# Patient Record
Sex: Female | Born: 1975
Health system: Southern US, Community
[De-identification: ages and names within clinical notes are randomized; demographics above are authoritative.]

## PROBLEM LIST (undated history)

## (undated) DIAGNOSIS — K219 Gastro-esophageal reflux disease without esophagitis: Secondary | ICD-10-CM

## (undated) DIAGNOSIS — F32A Depression, unspecified: Secondary | ICD-10-CM

## (undated) DIAGNOSIS — D573 Sickle-cell trait: Secondary | ICD-10-CM

## (undated) DIAGNOSIS — T7840XA Allergy, unspecified, initial encounter: Secondary | ICD-10-CM

## (undated) HISTORY — DX: Depression, unspecified: F32.A

## (undated) HISTORY — DX: Gastro-esophageal reflux disease without esophagitis: K21.9

## (undated) HISTORY — DX: Allergy, unspecified, initial encounter: T78.40XA

## (undated) HISTORY — DX: Sickle-cell trait: D57.3

---

## 1997-05-27 ENCOUNTER — Inpatient Hospital Stay (HOSPITAL_COMMUNITY): Admission: RE | Admit: 1997-05-27 | Discharge: 1997-05-27 | Payer: Self-pay | Admitting: Obstetrics & Gynecology

## 1998-10-24 ENCOUNTER — Inpatient Hospital Stay (HOSPITAL_COMMUNITY): Admission: AD | Admit: 1998-10-24 | Discharge: 1998-10-24 | Payer: Self-pay | Admitting: Obstetrics

## 1998-10-28 ENCOUNTER — Emergency Department (HOSPITAL_COMMUNITY): Admission: EM | Admit: 1998-10-28 | Discharge: 1998-10-28 | Payer: Self-pay | Admitting: Emergency Medicine

## 1999-12-19 ENCOUNTER — Emergency Department (HOSPITAL_COMMUNITY): Admission: EM | Admit: 1999-12-19 | Discharge: 1999-12-19 | Payer: Self-pay | Admitting: Emergency Medicine

## 2001-04-23 ENCOUNTER — Emergency Department (HOSPITAL_COMMUNITY): Admission: EM | Admit: 2001-04-23 | Discharge: 2001-04-23 | Payer: Self-pay | Admitting: *Deleted

## 2001-09-25 ENCOUNTER — Encounter: Payer: Self-pay | Admitting: Obstetrics and Gynecology

## 2001-09-25 ENCOUNTER — Inpatient Hospital Stay (HOSPITAL_COMMUNITY): Admission: AD | Admit: 2001-09-25 | Discharge: 2001-09-25 | Payer: Self-pay | Admitting: Obstetrics and Gynecology

## 2001-11-03 ENCOUNTER — Ambulatory Visit (HOSPITAL_COMMUNITY): Admission: RE | Admit: 2001-11-03 | Discharge: 2001-11-03 | Payer: Self-pay | Admitting: *Deleted

## 2001-11-03 ENCOUNTER — Encounter: Payer: Self-pay | Admitting: *Deleted

## 2001-12-08 ENCOUNTER — Ambulatory Visit (HOSPITAL_COMMUNITY): Admission: RE | Admit: 2001-12-08 | Discharge: 2001-12-08 | Payer: Self-pay | Admitting: *Deleted

## 2001-12-08 ENCOUNTER — Encounter: Payer: Self-pay | Admitting: *Deleted

## 2002-01-02 ENCOUNTER — Inpatient Hospital Stay (HOSPITAL_COMMUNITY): Admission: AD | Admit: 2002-01-02 | Discharge: 2002-01-02 | Payer: Self-pay | Admitting: *Deleted

## 2002-03-05 ENCOUNTER — Encounter (HOSPITAL_COMMUNITY): Admission: RE | Admit: 2002-03-05 | Discharge: 2002-03-16 | Payer: Self-pay | Admitting: *Deleted

## 2002-03-16 ENCOUNTER — Inpatient Hospital Stay (HOSPITAL_COMMUNITY): Admission: AD | Admit: 2002-03-16 | Discharge: 2002-03-20 | Payer: Self-pay | Admitting: *Deleted

## 2002-03-17 ENCOUNTER — Encounter (INDEPENDENT_AMBULATORY_CARE_PROVIDER_SITE_OTHER): Payer: Self-pay | Admitting: Specialist

## 2002-03-23 ENCOUNTER — Inpatient Hospital Stay (HOSPITAL_COMMUNITY): Admission: AD | Admit: 2002-03-23 | Discharge: 2002-03-23 | Payer: Self-pay | Admitting: Obstetrics and Gynecology

## 2002-11-26 ENCOUNTER — Emergency Department (HOSPITAL_COMMUNITY): Admission: EM | Admit: 2002-11-26 | Discharge: 2002-11-26 | Payer: Self-pay | Admitting: Emergency Medicine

## 2003-01-07 ENCOUNTER — Emergency Department (HOSPITAL_COMMUNITY): Admission: EM | Admit: 2003-01-07 | Discharge: 2003-01-07 | Payer: Self-pay | Admitting: Emergency Medicine

## 2003-03-11 ENCOUNTER — Emergency Department (HOSPITAL_COMMUNITY): Admission: EM | Admit: 2003-03-11 | Discharge: 2003-03-11 | Payer: Self-pay | Admitting: Emergency Medicine

## 2003-05-14 ENCOUNTER — Emergency Department (HOSPITAL_COMMUNITY): Admission: AD | Admit: 2003-05-14 | Discharge: 2003-05-14 | Payer: Self-pay | Admitting: Family Medicine

## 2003-07-23 ENCOUNTER — Emergency Department (HOSPITAL_COMMUNITY): Admission: EM | Admit: 2003-07-23 | Discharge: 2003-07-23 | Payer: Self-pay | Admitting: Family Medicine

## 2003-10-03 ENCOUNTER — Emergency Department (HOSPITAL_COMMUNITY): Admission: EM | Admit: 2003-10-03 | Discharge: 2003-10-03 | Payer: Self-pay | Admitting: Family Medicine

## 2003-10-08 ENCOUNTER — Emergency Department (HOSPITAL_COMMUNITY): Admission: EM | Admit: 2003-10-08 | Discharge: 2003-10-08 | Payer: Self-pay | Admitting: Family Medicine

## 2004-01-04 ENCOUNTER — Emergency Department (HOSPITAL_COMMUNITY): Admission: EM | Admit: 2004-01-04 | Discharge: 2004-01-04 | Payer: Self-pay | Admitting: *Deleted

## 2004-01-25 ENCOUNTER — Inpatient Hospital Stay (HOSPITAL_COMMUNITY): Admission: AD | Admit: 2004-01-25 | Discharge: 2004-01-25 | Payer: Self-pay | Admitting: *Deleted

## 2004-02-17 ENCOUNTER — Inpatient Hospital Stay (HOSPITAL_COMMUNITY): Admission: AD | Admit: 2004-02-17 | Discharge: 2004-02-17 | Payer: Self-pay | Admitting: Obstetrics & Gynecology

## 2004-04-01 ENCOUNTER — Ambulatory Visit (HOSPITAL_COMMUNITY): Admission: RE | Admit: 2004-04-01 | Discharge: 2004-04-01 | Payer: Self-pay | Admitting: *Deleted

## 2004-06-09 ENCOUNTER — Inpatient Hospital Stay (HOSPITAL_COMMUNITY): Admission: AD | Admit: 2004-06-09 | Discharge: 2004-06-09 | Payer: Self-pay | Admitting: Obstetrics & Gynecology

## 2004-08-28 ENCOUNTER — Ambulatory Visit: Payer: Self-pay | Admitting: *Deleted

## 2004-08-28 ENCOUNTER — Inpatient Hospital Stay (HOSPITAL_COMMUNITY): Admission: RE | Admit: 2004-08-28 | Discharge: 2004-08-31 | Payer: Self-pay | Admitting: *Deleted

## 2004-12-07 ENCOUNTER — Emergency Department (HOSPITAL_COMMUNITY): Admission: EM | Admit: 2004-12-07 | Discharge: 2004-12-07 | Payer: Self-pay | Admitting: Family Medicine

## 2005-01-01 ENCOUNTER — Emergency Department (HOSPITAL_COMMUNITY): Admission: EM | Admit: 2005-01-01 | Discharge: 2005-01-01 | Payer: Self-pay | Admitting: Emergency Medicine

## 2005-02-08 ENCOUNTER — Ambulatory Visit: Payer: Self-pay | Admitting: Family Medicine

## 2005-03-02 ENCOUNTER — Ambulatory Visit: Payer: Self-pay | Admitting: *Deleted

## 2005-05-14 ENCOUNTER — Emergency Department (HOSPITAL_COMMUNITY): Admission: EM | Admit: 2005-05-14 | Discharge: 2005-05-14 | Payer: Self-pay | Admitting: Family Medicine

## 2005-05-27 ENCOUNTER — Emergency Department (HOSPITAL_COMMUNITY): Admission: EM | Admit: 2005-05-27 | Discharge: 2005-05-27 | Payer: Self-pay | Admitting: Family Medicine

## 2005-05-31 ENCOUNTER — Emergency Department (HOSPITAL_COMMUNITY): Admission: EM | Admit: 2005-05-31 | Discharge: 2005-05-31 | Payer: Self-pay | Admitting: Family Medicine

## 2005-06-10 ENCOUNTER — Ambulatory Visit: Payer: Self-pay | Admitting: Family Medicine

## 2005-06-16 ENCOUNTER — Ambulatory Visit: Payer: Self-pay | Admitting: Family Medicine

## 2005-06-28 ENCOUNTER — Ambulatory Visit: Payer: Self-pay | Admitting: Family Medicine

## 2005-06-30 ENCOUNTER — Ambulatory Visit: Payer: Self-pay | Admitting: Family Medicine

## 2005-07-13 ENCOUNTER — Ambulatory Visit: Payer: Self-pay | Admitting: Family Medicine

## 2005-11-09 ENCOUNTER — Emergency Department (HOSPITAL_COMMUNITY): Admission: EM | Admit: 2005-11-09 | Discharge: 2005-11-09 | Payer: Self-pay | Admitting: Family Medicine

## 2006-01-03 ENCOUNTER — Emergency Department (HOSPITAL_COMMUNITY): Admission: EM | Admit: 2006-01-03 | Discharge: 2006-01-03 | Payer: Self-pay | Admitting: Emergency Medicine

## 2006-01-31 ENCOUNTER — Emergency Department (HOSPITAL_COMMUNITY): Admission: EM | Admit: 2006-01-31 | Discharge: 2006-01-31 | Payer: Self-pay | Admitting: Emergency Medicine

## 2006-02-01 ENCOUNTER — Emergency Department (HOSPITAL_COMMUNITY): Admission: EM | Admit: 2006-02-01 | Discharge: 2006-02-01 | Payer: Self-pay | Admitting: Family Medicine

## 2006-02-02 ENCOUNTER — Emergency Department (HOSPITAL_COMMUNITY): Admission: EM | Admit: 2006-02-02 | Discharge: 2006-02-02 | Payer: Self-pay | Admitting: Emergency Medicine

## 2007-03-20 ENCOUNTER — Emergency Department (HOSPITAL_COMMUNITY): Admission: EM | Admit: 2007-03-20 | Discharge: 2007-03-20 | Payer: Self-pay | Admitting: Family Medicine

## 2008-04-03 ENCOUNTER — Inpatient Hospital Stay (HOSPITAL_COMMUNITY): Admission: AD | Admit: 2008-04-03 | Discharge: 2008-04-03 | Payer: Self-pay | Admitting: Obstetrics & Gynecology

## 2008-04-29 ENCOUNTER — Ambulatory Visit (HOSPITAL_COMMUNITY): Admission: RE | Admit: 2008-04-29 | Discharge: 2008-04-29 | Payer: Self-pay | Admitting: Obstetrics & Gynecology

## 2008-05-21 ENCOUNTER — Ambulatory Visit (HOSPITAL_COMMUNITY): Admission: RE | Admit: 2008-05-21 | Discharge: 2008-05-21 | Payer: Self-pay | Admitting: Family Medicine

## 2008-06-20 ENCOUNTER — Ambulatory Visit (HOSPITAL_COMMUNITY): Admission: RE | Admit: 2008-06-20 | Discharge: 2008-06-20 | Payer: Self-pay | Admitting: Family Medicine

## 2008-10-09 ENCOUNTER — Inpatient Hospital Stay (HOSPITAL_COMMUNITY): Admission: RE | Admit: 2008-10-09 | Discharge: 2008-10-11 | Payer: Self-pay | Admitting: Obstetrics & Gynecology

## 2008-10-09 ENCOUNTER — Ambulatory Visit: Payer: Self-pay | Admitting: Obstetrics & Gynecology

## 2010-03-16 ENCOUNTER — Emergency Department (HOSPITAL_COMMUNITY): Admission: EM | Admit: 2010-03-16 | Discharge: 2010-03-16 | Payer: Self-pay | Admitting: Emergency Medicine

## 2010-07-07 LAB — POCT PREGNANCY, URINE: Preg Test, Ur: NEGATIVE

## 2010-08-03 LAB — CBC
HCT: 36 % (ref 36.0–46.0)
HCT: 37.7 % (ref 36.0–46.0)
Hemoglobin: 12.4 g/dL (ref 12.0–15.0)
Hemoglobin: 13 g/dL (ref 12.0–15.0)
MCHC: 34.4 g/dL (ref 30.0–36.0)
MCHC: 34.5 g/dL (ref 30.0–36.0)
MCV: 92.7 fL (ref 78.0–100.0)
MCV: 92.8 fL (ref 78.0–100.0)
Platelets: 264 10*3/uL (ref 150–400)
Platelets: 292 10*3/uL (ref 150–400)
RBC: 3.87 MIL/uL (ref 3.87–5.11)
RBC: 4.06 MIL/uL (ref 3.87–5.11)
RDW: 13.7 % (ref 11.5–15.5)
RDW: 13.9 % (ref 11.5–15.5)
WBC: 10.8 10*3/uL — ABNORMAL HIGH (ref 4.0–10.5)
WBC: 15.3 10*3/uL — ABNORMAL HIGH (ref 4.0–10.5)

## 2010-08-03 LAB — URINALYSIS, ROUTINE W REFLEX MICROSCOPIC
Bilirubin Urine: NEGATIVE
Glucose, UA: NEGATIVE mg/dL
Ketones, ur: NEGATIVE mg/dL
Nitrite: NEGATIVE
Protein, ur: NEGATIVE mg/dL
Specific Gravity, Urine: <1.005 — ABNORMAL LOW (ref 1.005–1.030)
Urobilinogen, UA: 0.2 mg/dL (ref 0.0–1.0)
pH: 6.5 (ref 5.0–8.0)

## 2010-08-03 LAB — TYPE AND SCREEN
ABO/RH(D): A POS
Antibody Screen: NEGATIVE

## 2010-08-03 LAB — ABO/RH: ABO/RH(D): A POS

## 2010-08-03 LAB — RPR: RPR Ser Ql: NONREACTIVE

## 2010-08-03 LAB — URINE MICROSCOPIC-ADD ON

## 2010-09-08 NOTE — Op Note (Signed)
NAME:  Pam Brown, Pam Brown             ACCOUNT NO.:  [id number]   MEDICAL RECORD NO.:  [id number]          PATIENT TYPE:  INP   LOCATION:  9147                          FACILITY:  [facility]   PHYSICIAN:  Horton Chin, MD DATE OF BIRTH:  Jun 03, 1975   DATE OF PROCEDURE:  10/09/2008  DATE OF DISCHARGE:                               OPERATIVE REPORT   PREOPERATIVE DIAGNOSIS:  Intrauterine pregnancy at 39-1/[redacted] weeks  gestation, 3 prior cesarean sections.   POSTOPERATIVE DIAGNOSIS:  Intrauterine pregnancy at 39-1/[redacted] weeks  gestation, 3 prior cesarean sections.   PROCEDURE:  Repeat low transverse cesarean section via a transverse skin  incision.   SURGEON:  Horton Chin, MD   ASSISTANTS:  Javier Glazier. Okey Dupre, MD and Warren Lacy, PA student.   ANESTHESIOLOGIST:  Raul Del, MD   ANESTHESIA:  Spinal.   INDICATIONS:  The patient is a 35 year old gravida 4, para 3 at 39-1/[redacted]  weeks gestation with a history of 3 prior cesarean sections, who was  here today for a scheduled repeat cesarean section.  The patient was  counseled about the risks of surgery including, but not limited to:  bleeding, infection, injury to the surrounding organs, need for  additional procedures, abnormal placentation in subsequent pregnancies,  and written informed consent was obtained.  Of note, the patient was  offered a bilateral tubal ligation, but she declined this as she does  not desire permanent sterilization.   FINDINGS:  Viable female infant in cephalic presentation.  Apgars were 8  and 9, weight 7 pounds 3 ounces.  There was clear amniotic fluid.  Intact placenta with 3-vessel cord.  Normal uterus and bilateral adnexa.   SPECIMENS:  Placenta which was sent to labor and delivery.   IV FLUIDS:  2500 mL.   URINE OUTPUT:  400 mL.   ESTIMATED BLOOD LOSS:  600 mL.   COMPLICATIONS:  None immediate.   PROCEDURE DETAILS:  The patient had sequential compression devices  applied to her bilateral lower  extremities and received intravenous  cefotetan while in the preoperative room.  She was then taken to the  operating room where spinal analgesia was administered and found to be  adequate.  The patient was then placed in the dorsal supine position  with a leftward tilt, and prepped and draped in a sterile manner.  A  foley catheter was placed in the patient's bladder and attached to  constant gravity.  Attention was then turned to the patient's abdomen,  where a low transverse incision was made.  This was about 2 cm superior  to her prior Pfannenstiel incisions.  This incision was made with a  scalpel and carried through to the underlying layer of fascia.  The  fascia was incised in the midline and extended bilaterally using Mayo  scissors.  At this point, there was noted to be dense adhesion of the  underlying rectus muscles to the fascia and this was dissected off  sharply.  There was also a prominent rectus muscles diastasis that was  noted and so the peritoneal layer was noted to be adherent to the  fascial layer and this was also carefully incised using Metzenbaum  scissors.  This incision was extended bilaterally with good  visualization of bowel and bladder.  At this point, there were some  filmy adhesions of the anterior part of the uterus to the peritoneum,  but this was taken down using electrocautery without complication.  Attention was then turned to the lower uterine segment, where a bladder  flap was created and a transverse hysterotomy was made.  This  hysterotomy was extended bilaterally in a blunt fashion.  The infant's  head was encountered and delivered atraumatically.  The rest of the  infant's body delivered without complication.  The cord was clamped and  cut and the infant was handed to the awaiting neonatologists.  Fundal  massage was then administered and the placenta delivered intact with 3-  vessel cord.  The uterus was then cleared of all clots and debris.   The  hysterotomy was repaired using 0 Monocryl with a running interlocking  fashion.  Overall good hemostasis was noted.  The pelvic gutters were  cleared of all clot and debris.  Good hemostasis was reconfirmed on all  surfaces.  The peritoneal layer was reapproximated using 2-0 Vicryl in a  running fashion.  The fascia was reapproximated using 0 PDS in a running  fashion, and the subcutaneous layer was irrigated and some bleeders were  controlled using electrocautery.  The skin was then closed with a 4-0  Vicryl subcuticular stitch.  The patient tolerated the procedure well.  Sponge, instrument, and needle counts were correct x3.  She was taken to  the recovery room in stable condition.      Horton Chin, MD  Electronically Signed     UAA/MEDQ  D:  10/09/2008  T:  10/10/2008  Job:  [id number]

## 2010-09-08 NOTE — Discharge Summary (Signed)
NAME:  Pam Brown, DAUENHAUER             ACCOUNT NO.:  [id number]   MEDICAL RECORD NO.:  [id number]          PATIENT TYPE:  INP   LOCATION:  9147                          [facility]  [facility]   PHYSICIAN:  Horton Chin, MD DATE OF BIRTH:  Dec 07, 1975   DATE OF ADMISSION:  10/09/2008  DATE OF DISCHARGE:  10/11/2008                               DISCHARGE SUMMARY   ADMISSION DIAGNOSES:  1. Intrauterine pregnancy at 39-1/7 weeks.  2. Previous cesarean section x3.   DISCHARGE DIAGNOSES:  1. Intrauterine pregnancy at 39-1/7 weeks.  2. Previous cesarean section x3.  3. Status post cesarean delivery of a viable female infant, Apgars 8 and      9, weighing 7 pounds 3 ounces.   HOSPITAL PROCEDURES:  1. Spinal anesthesia.  2. Repeat low-transverse cesarean section via transverse skin      incision.   HOSPITAL COURSE:  The patient was admitted for an elective repeat C-  section secondary to 3 prior C-sections.  Surgery was performed by Dr.  Macon Large with Dr. Okey Dupre assisting under spinal anesthesia.  She was  delivered of a female infant, cephalic presentation, Apgars 9 and 9  weighing 7 pounds 3 ounces, amniotic fluid was clear.  EBL was 600 mL.  She was taken to Mother Baby Unit where she received routine care.  On  postop day #1, she was doing well.  Foley was removed.  IV was removed.  The patient was tolerating food and fluids and being out of bed.  She  was breast and bottle feeding her baby and on postop day #2, she was  requesting early discharge.  Vital signs were stable.  She was afebrile.  She was breastfeeding with assistance and requesting Depo-Provera for  contraception.  PE was normal.  Chest was clear.  Heart, regular rate  and rhythm.  Abdomen, soft and appropriately tender.  Incision was  clean, dry and intact with subcutaneous sutures.  Lochia was small.  Extremities within normal limits.  She was deemed to receive full  benefit of her hospital stay and was discharged home.   CONDITION AT DISCHARGE:  Good.   DISCHARGE MEDICATIONS:  1. Colace 100 mg p.o. b.i.d.  2. Motrin 600 mg p.o. q.6 h. p.r.n.  3. Percocet 1-2 p.o. q.4 h. p.r.n.  4. Depo-Provera prior to discharge.   LABORATORY FINDINGS:  Admission white blood cell count 10.8, hemoglobin  13, platelets 292.  Blood type A+.  Discharge hemoglobin was 12.4 and  platelets were 264.   DISCHARGE INSTRUCTIONS:  Per handout.   DISCHARGE FOLLOWUP:  In 6 weeks at health department or sooner going to  have if needed.      Marie L. Williams, C.N.M.      Horton Chin, MD  Electronically Signed    MLW/MEDQ  D:  10/11/2008  T:  10/11/2008  Job:  [id number]

## 2010-09-11 NOTE — Discharge Summary (Signed)
NAME:  Pam Brown, Pam Brown NO.:  [id number]   MEDICAL RECORD NO.:  [id number]          PATIENT TYPE:  INP   LOCATION:  9127                          [facility]ILITY[facility]  [facility]   PHYSICIAN:  Conni Elliot, M.D.DATE OF BIRTH:  Sep 20, 1975   DATE OF ADMISSION:  08/28/2004  DATE OF DISCHARGE:                                 DISCHARGE SUMMARY   DISCHARGE DIAGNOSIS:  Status post low transverse cesarean section secondary  to repeat cesarean section.   HOSPITAL COURSE:  This is a 35 year old G3, P3-0-0-3 status post LTCS  secondary to two previous C-sections who had routine postpartum care.  Her  incision did not have any staples.  The patient had a normal postoperative  course.  On postoperative day #3, she was discharged with Percocet 5/325  every 4 hours as needed for pain and to continue her prenatal vitamins.  Her  postoperative H&H was 12 and 37 which was on Aug 29, 2004.  The patient was  rubella immune, A positive, and antibody negative.  She had no complications  during this hospitalization.  She will have a follow-up appointment in 6  weeks at [facility].  At her postoperative appointment, the patient  would like to have a Mirena IUD placed.  She will have an appointment at  [facility] October 08, 2004 at 10:00 a.m.      OR/MEDQ  D:  08/31/2004  T:  08/31/2004  Job:  [id number]

## 2010-09-11 NOTE — Op Note (Signed)
   NAME:  Pam Brown, Pam Brown                       ACCOUNT NO.:  [id number]   MEDICAL RECORD NO.:  [id number]                   PATIENT TYPE:  INP   LOCATION:  9115                                 [facility]   PHYSICIAN:  Phil D. Okey Dupre, M.D.                  DATE OF BIRTH:  05/20/1975   DATE OF PROCEDURE:  DATE OF DISCHARGE:                                 OPERATIVE REPORT   PREOPERATIVE DIAGNOSES:  1. Cephalopelvic disproportion.  2. Failed vaginal birth after cesarean.   POSTOPERATIVE DIAGNOSES:  1. Cephalopelvic disproportion.  2. Failed vaginal birth after caesarean.   PROCEDURE:  Low flap cesarean section.   SURGEON:  Javier Glazier. Rose, M.D.   DESCRIPTION OF PROCEDURE:  Under satisfactory epidural anesthesia with the  patient placed in the supine positio and a Foley catheter in the urinary  bladder the abdomen was prepped and draped in the usual sterile manner and  entered through a Pfannenstiel incision situated 3 cm above the symphysis  pubis and extended for a total length of 14 cm.  The abdomen was entered by  layers and entering the peritoneal cavity, the visceral peritoneum and  anterior surface of the uterus was opened transversely by sharp dissection.  The bladder was pushed away from the lower uterine segment and was entered  by sharp and blunt dissection and from an LOP presentation, the baby was  easily delivered.  It was a female with Apgars of 8 and 9, weight unknown at  this point.  The cord was doubly clamped and divided and the baby handed to  the pediatrician.  The placenta was manually removed, and the uterus  explored.  The uterus was closed with a continuous running locked suture  Vicryl on an atraumatic needle.  The bladder was observed to make sure there  were no signs of any injury, and the fascia was closed by incision with  continuous alternating 0 Vicryl on an atraumatic needle.  Subcutaneous  bleeders were controlled with hot cautery.  Skin edge  approximation with  skin staples.  Dry sterile dressing was applied.  Total blood loss during  the procedure was 700 cc.  The patient tolerated the procedure well and was  transferred to the recovery room in satisfactory condition.  The urine was  bloody prior to the procedure but was deemed much less bloody  postoperatively.  The patient received 2 g of Ancef after the baby was  delivered.                                               Phil D. Okey Dupre, M.D.    PDR/MEDQ  D:  03/17/2002  T:  03/17/2002  Job:  [id number]

## 2010-09-11 NOTE — Discharge Summary (Signed)
NAME:  Pam Brown, Pam Brown                       ACCOUNT NO.:  [id number]   MEDICAL RECORD NO.:  [id number]                   PATIENT TYPE:  INP   LOCATION:  9115                                 [facility]   PHYSICIAN:  Conni Elliot, M.D.             DATE OF BIRTH:  03-08-1976   DATE OF ADMISSION:  03/16/2002  DATE OF DISCHARGE:  03/20/2002                                 DISCHARGE SUMMARY   DISCHARGE MEDICATIONS:  1. Ibuprofen 600 mg one p.o. q.6h. p.r.n.  2. Percocet 5/325 one p.o. q.4h. p.r.n.  3. Prenatal vitamins one p.o. q.d. for six weeks.  4. Micronor one p.o. q.d. to start in two weeks postpartum.   DISCHARGE INSTRUCTIONS:  The patient is instructed for no heavy lifting or  strenuous exercise for six weeks, and pelvic rest for six weeks.   DISCHARGE DIAGNOSIS:  Postoperative day #3 status post repeat low transverse  cesarean section.   HISTORY OF PRESENT ILLNESS:  The patient is a 35 year old on admission G2 P1-  0-0-1 at 53 and 0 weeks that presented for induction of labor and on the  same day contractions had started occurring every 10 minutes.  Her membranes  were intact and she had no vaginal bleeding.  She had good fetal movement.  Prenatal care was done at [facility] with onset of care at 21 weeks.  She had a previous C section for failure to progress in 1999.   MEDICAL HISTORY:  Negative.   GYNECOLOIGCAL HISTORY:  Negative.   PRENATAL LABORATORY DATA:  The patient's blood type was A positive, antibody  negative.  Platelet count 299.  She was rubella immune.  Hepatitis B  negative.  Syphilis nonreactive.  HIV negative.  GC and chlamydia negative.  She was GBS positive on October 6 with a one-hour Glucola of 77.   HOSPITAL COURSE:  For induction of labor the patient was started on low-dose  Pitocin and Penicillin G for her intrapartum treatment group B strep.  The  patient was anticipating a VBAC and received an epidural two hours later.  Fetal  heart tones remained reassuring.  The patient then received an  amnioinfusion of 500 cc of lactated Ringers, was titrated on her Pitocin.  The patient had a protracted labor pattern without fetal descent.  Fetal  heart tones showed variable decelerations.  Meconium-stained fluid was  thinned with amnioinfusion.  Cervix completely dilated but the fetus failed  to descend.  The patient was then taken to the OR for a STAT C section and  received a repeat low transverse C section after failing VBAC attempt.  A  term female infant was born with Apgars of 8 at one and 9 at 93.  Estimated  blood loss was 700 cc.  Anesthesia was through epidural.  Intact three-  vessel-cord placenta was delivered shortly after the infant.   After delivery the patient remained stable with a hemoglobin  at 11.4 from  13.2.  Vital signs remained stable.  The patient spiked one low-grade fever  on postpartum day #2 at 100.4 degrees; it was the only elevated temperature.  The patient was asymptomatic with no shortness of breath, no dysuria or CVA  tenderness, no cough or chills.  Incision remains intact with staples  without erythema or hematoma noted.  Due to the large size of her pannus we  will leave staples in for two additional days postpartum.  The patient will  return to MAU postpartum day #5 for staple removal.  Circumcision has been  done.  The patient plans to breast and bottle feed and will be starting on  Micronor for birth control two weeks postpartum.     Lorne Skeens, D.O.                         Conni Elliot, M.D.    Erick Alley  D:  03/20/2002  T:  03/20/2002  Job:  [id number]   cc:   Dr. Mal[facility]

## 2010-09-11 NOTE — Op Note (Signed)
NAME:  Pam Brown, Pam Brown NO.:  [id number]   MEDICAL RECORD NO.:  [id number]          PATIENT TYPE:  INP   LOCATION:  9127                          [facility]   PHYSICIAN:  Conni Elliot, M.D.DATE OF BIRTH:  1975/05/18   DATE OF PROCEDURE:  08/28/2004  DATE OF DISCHARGE:                                 OPERATIVE REPORT   PREOPERATIVE DIAGNOSES:  Prior cesarean delivery x2.   POSTOPERATIVE DIAGNOSES:  Prior cesarean delivery x2.   OPERATION:  Repeat low transverse cesarean.   SURGEON:  Conni Elliot, M.D.   ASSISTANT:  Tawnya Crook. Erenest Rasher, M.D.   ANESTHESIA:  Spinal.   DESCRIPTION OF PROCEDURE:  After placing the patient on spinal anesthetic,  patient supine in left lateral tilt position, the abdomen was prepped and  draped in a sterile fashion. A low transverse Pfannenstiel incision was made  through the skin and fascia. Rectus muscles incised to the midline. The  peritoneal cavity entered, bladder flap created. A low transverse uterine  incision was made. The baby was delivered from the vertex presentation with  the assistance of a vacuum. The remainder of the body was delivered. Cord  doubly clamped and cut. Baby was handed to the neonatologist in attendance.  Placenta delivered spontaneously. The uterus, bladder flap, anterior  peritoneum and fascia were closed in routine fashion. The patient was  concerned about diastasis recti; however, an attempt was made to bring the  rectus muscles back together but they were too far apart to carefully  reoppose them.  The subcutaneous skin was closed with subcutaneous stitch.  Estimated blood loss was about 700 mL without replacement.      ASG/MEDQ  D:  08/28/2004  T:  08/28/2004  Job:  [id number]

## 2011-01-28 LAB — WET PREP, GENITAL: Yeast Wet Prep HPF POC: NONE SEEN

## 2011-01-28 LAB — URINALYSIS, ROUTINE W REFLEX MICROSCOPIC
Bilirubin Urine: NEGATIVE
Glucose, UA: NEGATIVE mg/dL
Hgb urine dipstick: NEGATIVE
Ketones, ur: NEGATIVE mg/dL
Nitrite: NEGATIVE
Protein, ur: NEGATIVE mg/dL
Specific Gravity, Urine: 1.01 (ref 1.005–1.030)
Urobilinogen, UA: 0.2 mg/dL (ref 0.0–1.0)
pH: 6 (ref 5.0–8.0)

## 2011-01-28 LAB — URINE MICROSCOPIC-ADD ON

## 2014-04-02 ENCOUNTER — Encounter (HOSPITAL_COMMUNITY): Payer: Self-pay | Admitting: Emergency Medicine

## 2014-04-02 ENCOUNTER — Emergency Department (INDEPENDENT_AMBULATORY_CARE_PROVIDER_SITE_OTHER)
Admission: EM | Admit: 2014-04-02 | Discharge: 2014-04-02 | Disposition: A | Discharge status: Home / Self Care | Payer: Self-pay | Source: Home / Self Care | Attending: Family Medicine | Admitting: Family Medicine

## 2014-04-02 DIAGNOSIS — A084 Viral intestinal infection, unspecified: Secondary | ICD-10-CM

## 2014-04-02 MED ORDER — ONDANSETRON 4 MG PO TBDP
ORAL_TABLET | ORAL | Status: AC
  Filled 2014-04-02: qty 2

## 2014-04-02 MED ORDER — DIPHENOXYLATE-ATROPINE 2.5-0.025 MG PO TABS: 1.0000 | ORAL_TABLET | Freq: Four times a day (QID) | ORAL | Status: DC | PRN

## 2014-04-02 MED ORDER — ONDANSETRON 8 MG PO TBDP: 8.0000 mg | ORAL_TABLET | Freq: Three times a day (TID) | ORAL | Status: DC | PRN

## 2014-04-02 MED ORDER — ONDANSETRON 4 MG PO TBDP
8.0000 mg | ORAL_TABLET | Freq: Once | ORAL | Status: AC
  Administered 2014-04-02: 8 mg via ORAL

## 2014-04-02 NOTE — ED Provider Notes (Signed)
CSN: [id number]     Arrival date & time 04/02/14  1336 History   First MD Initiated Contact with Patient 04/02/14 1358     Chief Complaint  Patient presents with  . Diarrhea  . Emesis   (Consider location/radiation/quality/duration/timing/severity/associated sxs/prior Treatment) HPI           38 year old female presents for evaluation of vomiting and diarrhea. She woke up early this morning with acute onset of vomiting, followed soon thereafter by the diarrhea. She has had multiple episodes this morning. She is starting to have pain around her rectum as well as abdominal pain. 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 She denies fever, chills,. No recent travel. No blood in diarrhea or vomitus. No dizziness or lightheadedness.   Marland KitchenHistory reviewed. No pertinent past medical history. History reviewed. No pertinent past surgical history. No family history on file. History  Substance Use Topics  . Smoking status: Never Smoker   . Smokeless tobacco: Not on file  . Alcohol Use: No   OB History    No data available     Review of Systems  Constitutional: Negative for fever and chills.  Gastrointestinal: Positive for nausea, vomiting, abdominal pain and diarrhea. Negative for constipation and blood in stool.  Neurological: Negative for dizziness.  All other systems reviewed and are negative.   Allergies  Review of patient's allergies indicates not on file.  Home Medications   Prior to Admission medications   Medication Sig Start Date End Date Taking? Authorizing Provider  diphenoxylate-atropine (LOMOTIL) 2.5-0.025 MG per tablet Take 1-2 tablets by mouth 4 (four) times daily as needed for diarrhea or loose stools. 04/02/14   Freeman Caldron Mattia Liford, PA-C  ondansetron (ZOFRAN ODT) 8 MG disintegrating tablet Take 1 tablet (8 mg total) by mouth every 8 (eight) hours as needed for nausea or vomiting. 04/02/14   Freeman Caldron Justyn Langham, PA-C   BP 117/73 mmHg   Pulse 105  Temp(Src) 99.9 F (37.7 C) (Oral)  Resp 20  SpO2 97%  LMP 03/19/2014 Physical Exam  Constitutional: She is oriented to person, place, and time. Vital signs are normal. She appears well-developed and well-nourished. No distress.  HENT:  Head: Normocephalic and atraumatic.  Cardiovascular: Normal rate, regular rhythm and normal heart sounds.  Exam reveals no gallop and no friction rub.   No murmur heard. Tachycardic  Pulmonary/Chest: Effort normal and breath sounds normal. No respiratory distress. She has no wheezes. She has no rales.  Abdominal: Soft. Bowel sounds are normal. She exhibits no distension and no mass. There is no tenderness. There is no rebound and no guarding.  Neurological: She is alert and oriented to person, place, and time. She has normal strength. Coordination normal.  Skin: Skin is warm and dry. No rash noted. She is not diaphoretic.  Psychiatric: She has a normal mood and affect. Judgment normal.  Nursing note and vitals reviewed.   ED Course  Procedures (including critical care time) Labs Review Labs Reviewed - No data to display  Imaging Review No results found.   MDM   1. Viral gastroenteritis     physical exam is normal other than mild tachycardia. The abdomen is soft and nontender, completely benign. This is most likely a viral gastroenteritis. Will prescribe Zofran and Lomotil for symptoms. Clear liquid diet for now and advance as tolerated. Follow-up if any worsening   Meds ordered this encounter  Medications  . ondansetron (ZOFRAN-ODT) disintegrating tablet 8  mg    Sig:   . ondansetron (ZOFRAN ODT) 8 MG disintegrating tablet    Sig: Take 1 tablet (8 mg total) by mouth every 8 (eight) hours as needed for nausea or vomiting.    Dispense:  12 tablet    Refill:  0    Order Specific Question:  Supervising Provider    Answer:  Billy Fischer [phone]  . diphenoxylate-atropine (LOMOTIL) 2.5-0.025 MG per tablet    Sig: Take 1-2 tablets by  mouth 4 (four) times daily as needed for diarrhea or loose stools.    Dispense:  20 tablet    Refill:  0    Order Specific Question:  Supervising Provider    Answer:  Ihor Gully D [place], PA-C 04/02/14 1445

## 2014-04-02 NOTE — ED Notes (Signed)
Woke 2 different times with diarrhea and vomiting.  .  Reports 7 episodes of vomiting, 6 episodes of diarrhea today.  Abdominal soreness.  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

## 2014-04-02 NOTE — Discharge Instructions (Signed)
Food Choices to Help Relieve Diarrhea °When you have diarrhea, the foods you eat and your eating habits are very important. Choosing the right foods and drinks can help relieve diarrhea. Also, because diarrhea can last up to 7 days, you need to replace lost fluids and electrolytes (such as sodium, potassium, and chloride) in order to help prevent dehydration.  °WHAT GENERAL GUIDELINES DO I NEED TO FOLLOW? °· Slowly drink 1 cup (8 oz) of fluid for each episode of diarrhea. If you are getting enough fluid, your urine will be clear or pale yellow. °· Eat starchy foods. Some good choices include white rice, white toast, pasta, low-fiber cereal, baked potatoes (without the skin), saltine crackers, and bagels. °· Avoid large servings of any cooked vegetables. °· Limit fruit to two servings per day. A serving is ½ cup or 1 small piece. °· Choose foods with less than 2 g of fiber per serving. °· Limit fats to less than 8 tsp (38 g) per day. °· Avoid fried foods. °· Eat foods that have probiotics in them. Probiotics can be found in certain dairy products. °· Avoid foods and beverages that may increase the speed at which food moves through the stomach and intestines (gastrointestinal tract). Things to avoid include: °¨ High-fiber foods, such as dried fruit, raw fruits and vegetables, nuts, seeds, and whole grain foods. °¨ Spicy foods and high-fat foods. °¨ Foods and beverages sweetened with high-fructose corn syrup, honey, or sugar alcohols such as xylitol, sorbitol, and mannitol. °WHAT FOODS ARE RECOMMENDED? °Grains °White rice. White, French, or pita breads (fresh or toasted), including plain rolls, buns, or bagels. White pasta. Saltine, soda, or graham crackers. Pretzels. Low-fiber cereal. Cooked cereals made with water (such as cornmeal, farina, or cream cereals). Plain muffins. Matzo. Melba toast. Zwieback.  °Vegetables °Potatoes (without the skin). Strained tomato and vegetable juices. Most well-cooked and canned  vegetables without seeds. Tender lettuce. °Fruits °Cooked or canned applesauce, apricots, cherries, fruit cocktail, grapefruit, peaches, pears, or plums. Fresh bananas, apples without skin, cherries, grapes, cantaloupe, grapefruit, peaches, oranges, or plums.  °Meat and Other Protein Products °Baked or boiled chicken. Eggs. Tofu. Fish. Seafood. Smooth peanut butter. Ground or well-cooked tender beef, ham, veal, lamb, pork, or poultry.  °Dairy °Plain yogurt, kefir, and unsweetened liquid yogurt. Lactose-free milk, buttermilk, or soy milk. Plain hard cheese. °Beverages °Sport drinks. Clear broths. Diluted fruit juices (except prune). Regular, caffeine-free sodas such as ginger ale. Water. Decaffeinated teas. Oral rehydration solutions. Sugar-free beverages not sweetened with sugar alcohols. °Other °Bouillon, broth, or soups made from recommended foods.  °The items listed above may not be a complete list of recommended foods or beverages. Contact your dietitian for more options. °WHAT FOODS ARE NOT RECOMMENDED? °Grains °Whole grain, whole wheat, bran, or rye breads, rolls, pastas, crackers, and cereals. Wild or brown rice. Cereals that contain more than 2 g of fiber per serving. Corn tortillas or taco shells. Cooked or dry oatmeal. Granola. Popcorn. °Vegetables °Raw vegetables. Cabbage, broccoli, Brussels sprouts, artichokes, baked beans, beet greens, corn, kale, legumes, peas, sweet potatoes, and yams. Potato skins. Cooked spinach and cabbage. °Fruits °Dried fruit, including raisins and dates. Raw fruits. Stewed or dried prunes. Fresh apples with skin, apricots, mangoes, pears, raspberries, and strawberries.  °Meat and Other Protein Products °Chunky peanut butter. Nuts and seeds. Beans and lentils. Bacon.  °Dairy °High-fat cheeses. Milk, chocolate milk, and beverages made with milk, such as milk shakes. Cream. Ice cream. °Sweets and Desserts °Sweet rolls, doughnuts, and sweet breads. Pancakes   and waffles. °Fats and  Oils °Butter. Cream sauces. Margarine. Salad oils. Plain salad dressings. Olives. Avocados.  °Beverages °Caffeinated beverages (such as coffee, tea, soda, or energy drinks). Alcoholic beverages. Fruit juices with pulp. Prune juice. Soft drinks sweetened with high-fructose corn syrup or sugar alcohols. °Other °Coconut. Hot sauce. Chili powder. Mayonnaise. Gravy. Cream-based or milk-based soups.  °The items listed above may not be a complete list of foods and beverages to avoid. Contact your dietitian for more information. °WHAT SHOULD I DO IF I BECOME DEHYDRATED? °Diarrhea can sometimes lead to dehydration. Signs of dehydration include dark urine and dry mouth and skin. If you think you are dehydrated, you should rehydrate with an oral rehydration solution. These solutions can be purchased at pharmacies, retail stores, or online.  °Drink ½-1 cup (120-240 mL) of oral rehydration solution each time you have an episode of diarrhea. If drinking this amount makes your diarrhea worse, try drinking smaller amounts more often. For example, drink 1-3 tsp (5-15 mL) every 5-10 minutes.  °A general rule for staying hydrated is to drink 1½-2 L of fluid per day. Talk to your health care provider about the specific amount you should be drinking each day. Drink enough fluids to keep your urine clear or pale yellow. °Document Released: 07/03/2003 Document Revised: 04/17/2013 Document Reviewed: 03/05/2013 °ExitCare® Patient Information ©2015 [employer]. This information is not intended to replace advice given to you by your health care provider. Make sure you discuss any questions you have with your health care provider. °Viral Gastroenteritis °Viral gastroenteritis is also known as stomach flu. This condition affects the stomach and intestinal tract. It can cause sudden diarrhea and vomiting. The illness typically lasts 3 to 8 days. Most people develop an immune response that eventually gets rid of the virus. While this natural  response develops, the virus can make you quite ill. °CAUSES  °Many different viruses can cause gastroenteritis, such as rotavirus or noroviruses. You can catch one of these viruses by consuming contaminated food or water. You may also catch a virus by sharing utensils or other personal items with an infected person or by touching a contaminated surface. °SYMPTOMS  °The most common symptoms are diarrhea and vomiting. These problems can cause a severe loss of body fluids (dehydration) and a body salt (electrolyte) imbalance. Other symptoms may include: °· Fever. °· Headache. °· Fatigue. °· Abdominal pain. °DIAGNOSIS  °Your caregiver can usually diagnose viral gastroenteritis based on your symptoms and a physical exam. A stool sample may also be taken to test for the presence of viruses or other infections. °TREATMENT  °This illness typically goes away on its own. Treatments are aimed at rehydration. The most serious cases of viral gastroenteritis involve vomiting so severely that you are not able to keep fluids down. In these cases, fluids must be given through an intravenous line (IV). °HOME CARE INSTRUCTIONS  °· Drink enough fluids to keep your urine clear or pale yellow. Drink small amounts of fluids frequently and increase the amounts as tolerated. °· Ask your caregiver for specific rehydration instructions. °· Avoid: °¨ Foods high in sugar. °¨ Alcohol. °¨ Carbonated drinks. °¨ Tobacco. °¨ Juice. °¨ Caffeine drinks. °¨ Extremely hot or cold fluids. °¨ Fatty, greasy foods. °¨ Too much intake of anything at one time. °¨ Dairy products until 24 to 48 hours after diarrhea stops. °· You may consume probiotics. Probiotics are active cultures of beneficial bacteria. They may lessen the amount and number of diarrheal stools in adults. Probiotics   can be found in yogurt with active cultures and in supplements. °· Wash your hands well to avoid spreading the virus. °· Only take over-the-counter or prescription medicines for  pain, discomfort, or fever as directed by your caregiver. Do not give aspirin to children. Antidiarrheal medicines are not recommended. °· Ask your caregiver if you should continue to take your regular prescribed and over-the-counter medicines. °· Keep all follow-up appointments as directed by your caregiver. °SEEK IMMEDIATE MEDICAL CARE IF:  °· You are unable to keep fluids down. °· You do not urinate at least once every 6 to 8 hours. °· You develop shortness of breath. °· You notice blood in your stool or vomit. This may look like coffee grounds. °· You have abdominal pain that increases or is concentrated in one small area (localized). °· You have persistent vomiting or diarrhea. °· You have a fever. °· The patient is a child younger than 3 months, and he or she has a fever. °· The patient is a child older than 3 months, and he or she has a fever and persistent symptoms. °· The patient is a child older than 3 months, and he or she has a fever and symptoms suddenly get worse. °· The patient is a baby, and he or she has no tears when crying. °MAKE SURE YOU:  °· Understand these instructions. °· Will watch your condition. °· Will get help right away if you are not doing well or get worse. °Document Released: 04/12/2005 Document Revised: 07/05/2011 Document Reviewed: 01/27/2011 °ExitCare® Patient Information ©2015 [employer]. This information is not intended to replace advice given to you by your health care provider. Make sure you discuss any questions you have with your health care provider. ° °

## 2016-02-21 ENCOUNTER — Ambulatory Visit (INDEPENDENT_AMBULATORY_CARE_PROVIDER_SITE_OTHER): Payer: BLUE CROSS/BLUE SHIELD | Admitting: Urgent Care

## 2016-02-21 VITALS — BP 112/68 | HR 67 | Temp 98.7°F | Resp 17 | Ht 60.0 in | Wt 132.0 lb

## 2016-02-21 DIAGNOSIS — Z23 Encounter for immunization: Secondary | ICD-10-CM | POA: Diagnosis not present

## 2016-02-21 DIAGNOSIS — N946 Dysmenorrhea, unspecified: Secondary | ICD-10-CM

## 2016-02-21 DIAGNOSIS — R87610 Atypical squamous cells of undetermined significance on cytologic smear of cervix (ASC-US): Secondary | ICD-10-CM | POA: Diagnosis not present

## 2016-02-21 DIAGNOSIS — Z3009 Encounter for other general counseling and advice on contraception: Secondary | ICD-10-CM | POA: Diagnosis not present

## 2016-02-21 DIAGNOSIS — Z0000 Encounter for general adult medical examination without abnormal findings: Secondary | ICD-10-CM | POA: Diagnosis not present

## 2016-02-21 LAB — LIPID PANEL
CHOL/HDL RATIO: 4.3 ratio (ref <=5.0)
Cholesterol: 152 mg/dL (ref 125–200)
HDL: 35 mg/dL — AB (ref >=46)
LDL CALC: 104 mg/dL (ref <130)
TRIGLYCERIDES: 67 mg/dL (ref <150)
VLDL: 13 mg/dL (ref <30)

## 2016-02-21 LAB — COMPREHENSIVE METABOLIC PANEL
ALT: 15 U/L (ref 6–29)
AST: 16 U/L (ref 10–30)
Albumin: 4 g/dL (ref 3.6–5.1)
Alkaline Phosphatase: 79 U/L (ref 33–115)
BUN: 8 mg/dL (ref 7–25)
CALCIUM: 9.3 mg/dL (ref 8.6–10.2)
CHLORIDE: 108 mmol/L (ref 98–110)
CO2: 24 mmol/L (ref 20–31)
Creat: 0.71 mg/dL (ref 0.50–1.10)
GLUCOSE: 80 mg/dL (ref 65–99)
POTASSIUM: 5 mmol/L (ref 3.5–5.3)
Sodium: 141 mmol/L (ref 135–146)
Total Bilirubin: 0.3 mg/dL (ref 0.2–1.2)
Total Protein: 6.7 g/dL (ref 6.1–8.1)

## 2016-02-21 LAB — TSH: TSH: 1.51 m[IU]/L

## 2016-02-21 LAB — CBC
HCT: 36.6 % (ref 35.0–45.0)
Hemoglobin: 12.2 g/dL (ref 11.7–15.5)
MCH: 27.1 pg (ref 27.0–33.0)
MCHC: 33.3 g/dL (ref 32.0–36.0)
MCV: 81.2 fL (ref 80.0–100.0)
MPV: 8.2 fL (ref 7.5–12.5)
PLATELETS: 363 10*3/uL (ref 140–400)
RBC: 4.51 MIL/uL (ref 3.80–5.10)
RDW: 15 % (ref 11.0–15.0)
WBC: 6.8 10*3/uL (ref 3.8–10.8)

## 2016-02-21 LAB — POCT URINE PREGNANCY: PREG TEST UR: NEGATIVE

## 2016-02-21 MED ORDER — MEDROXYPROGESTERONE ACETATE 150 MG/ML IM SUSP
150.0000 mg | Freq: Once | INTRAMUSCULAR | Status: AC
  Administered 2016-02-21: 150 mg via INTRAMUSCULAR

## 2016-02-21 NOTE — Patient Instructions (Addendum)
Keeping You Healthy  Get These Tests 1. Blood Pressure- Have your blood pressure checked once a year by your health care provider.  Normal blood pressure is 120/80. 2. Weight- Have your body mass index (BMI) calculated to screen for obesity.  BMI is measure of body fat based on height and weight.  You can also calculate your own BMI at [email or web address]. 3. Cholesterol- Have your cholesterol checked every 5 years starting at age 400 then yearly starting at age 40. 30. Chlamydia, HIV, and other sexually transmitted diseases- Get screened every year until age 40, then within three months of each new sexual provider. 5. Pap Test - Every 1-5 years; discuss with your health care provider. 6. Mammogram- Every 1-2 years starting at age 400--50  Take these medicines  Calcium with Vitamin D-Your body needs 1200 mg of Calcium each day and [phone] IU of Vitamin D daily.  Your body can only absorb 500 mg of Calcium at a time so Calcium must be taken in 2 or 3 divided doses throughout the day.  Multivitamin with folic acid- Once daily if it is possible for you to become pregnant.  Get these Immunizations  Gardasil-Series of three doses; prevents HPV related illness such as genital warts and cervical cancer.  Menactra-Single dose; prevents meningitis.  Tetanus shot- Every 10 years.  Flu shot-Every year.  Take these steps 1. Do not smoke-Your healthcare provider can help you quit.  For tips on how to quit go to www.smokefree.gov or call 1-800 QUITNOW. 2. Be physically active- Exercise 5 days a week for at least 30 minutes.  If you are not already physically active, start slow and gradually work up to 30 minutes of moderate physical activity.  Examples of moderate activity include walking briskly, dancing, swimming, bicycling, etc. 3. Breast Cancer- A self breast exam every month is important for early detection of breast cancer.  For more information and instruction on self breast exams, ask your  healthcare provider or https://www.patel.info/. 4. Eat a healthy diet- Eat a variety of healthy foods such as fruits, vegetables, whole grains, low fat milk, low fat cheeses, yogurt, lean meats, poultry and fish, beans, nuts, tofu, etc.  For more information go to www. Thenutritionsource.org 5. Drink alcohol in moderation- Limit alcohol intake to one drink or less per day. Never drink and drive. 6. Depression- Your emotional health is as important as your physical health.  If you're feeling down or losing interest in things you normally enjoy please talk to your healthcare provider about being screened for depression. 7. Dental visit- Brush and floss your teeth twice daily; visit your dentist twice a year. 8. Eye doctor- Get an eye exam at least every 2 years. 9. Helmet use- Always wear a helmet when riding a bicycle, motorcycle, rollerblading or skateboarding. 27. Safe sex- If you may be exposed to sexually transmitted infections, use a condom. 11. Seat belts- Seat belts can save your live; always wear one. 12. Smoke/Carbon Monoxide detectors- These detectors need to be installed on the appropriate level of your home. Replace batteries at least once a year. 13. Skin cancer- When out in the sun please cover up and use sunscreen 15 SPF or higher. 14. Violence- If anyone is threatening or hurting you, please tell your healthcare provider.    Medroxyprogesterone injection [Contraceptive] What is this medicine? MEDROXYPROGESTERONE (me DROX ee proe JES te rone) contraceptive injections prevent pregnancy. They provide effective birth control for 3 months. Depo-subQ Provera 104 is also used for treating  pain related to endometriosis. This medicine may be used for other purposes; ask your health care provider or pharmacist if you have questions. What should I tell my health care provider before I take this medicine? They need to know if you have any of these conditions: -frequently  drink alcohol -asthma -blood vessel disease or a history of a blood clot in the lungs or legs -bone disease such as osteoporosis -breast cancer -diabetes -eating disorder (anorexia nervosa or bulimia) -high blood pressure -HIV infection or AIDS -kidney disease -liver disease -mental depression -migraine -seizures (convulsions) -stroke -tobacco smoker -vaginal bleeding -an unusual or allergic reaction to medroxyprogesterone, other hormones, medicines, foods, dyes, or preservatives -pregnant or trying to get pregnant -breast-feeding How should I use this medicine? Depo-Provera Contraceptive injection is given into a muscle. Depo-subQ Provera 104 injection is given under the skin. These injections are given by a health care professional. You must not be pregnant before getting an injection. The injection is usually given during the first 5 days after the start of a menstrual period or 6 weeks after delivery of a baby. Talk to your pediatrician regarding the use of this medicine in children. Special care may be needed. These injections have been used in female children who have started having menstrual periods. Overdosage: If you think you have taken too much of this medicine contact a poison control center or emergency room at once. NOTE: This medicine is only for you. Do not share this medicine with others. What if I miss a dose? Try not to miss a dose. You must get an injection once every 3 months to maintain birth control. If you cannot keep an appointment, call and reschedule it. If you wait longer than 13 weeks between Depo-Provera contraceptive injections or longer than 14 weeks between Depo-subQ Provera 104 injections, you could get pregnant. Use another method for birth control if you miss your appointment. You may also need a pregnancy test before receiving another injection. What may interact with this medicine? Do not take this medicine with any of the following  medications: -bosentan This medicine may also interact with the following medications: -aminoglutethimide -antibiotics or medicines for infections, especially rifampin, rifabutin, rifapentine, and griseofulvin -aprepitant -barbiturate medicines such as phenobarbital or primidone -bexarotene -carbamazepine -medicines for seizures like ethotoin, felbamate, oxcarbazepine, phenytoin, topiramate -modafinil -St. John's wort This list may not describe all possible interactions. Give your health care provider a list of all the medicines, herbs, non-prescription drugs, or dietary supplements you use. Also tell them if you smoke, drink alcohol, or use illegal drugs. Some items may interact with your medicine. What should I watch for while using this medicine? This drug does not protect you against HIV infection (AIDS) or other sexually transmitted diseases. Use of this product may cause you to lose calcium from your bones. Loss of calcium may cause weak bones (osteoporosis). Only use this product for more than 2 years if other forms of birth control are not right for you. The longer you use this product for birth control the more likely you will be at risk for weak bones. Ask your health care professional how you can keep strong bones. You may have a change in bleeding pattern or irregular periods. Many females stop having periods while taking this drug. If you have received your injections on time, your chance of being pregnant is very low. If you think you may be pregnant, see your health care professional as soon as possible. Tell your health care professional  if you want to get pregnant within the next year. The effect of this medicine may last a long time after you get your last injection. What side effects may I notice from receiving this medicine? Side effects that you should report to your doctor or health care professional as soon as possible: -allergic reactions like skin rash, itching or hives,  swelling of the face, lips, or tongue -breast tenderness or discharge -breathing problems -changes in vision -depression -feeling faint or lightheaded, falls -fever -pain in the abdomen, chest, groin, or leg -problems with balance, talking, walking -unusually weak or tired -yellowing of the eyes or skin Side effects that usually do not require medical attention (report to your doctor or health care professional if they continue or are bothersome): -acne -fluid retention and swelling -headache -irregular periods, spotting, or absent periods -temporary pain, itching, or skin reaction at site where injected -weight gain This list may not describe all possible side effects. Call your doctor for medical advice about side effects. You may report side effects to FDA at 1-800-FDA-1088. Where should I keep my medicine? This does not apply. The injection will be given to you by a health care professional. NOTE: This sheet is a summary. It may not cover all possible information. If you have questions about this medicine, talk to your doctor, pharmacist, or health care provider.    2016, [employer]. (2008-05-03 18:37:56)     IF you received an x-ray today, you will receive an invoice from [facility]. Please contact [facility] at [phone] with questions or concerns regarding your invoice.   IF you received labwork today, you will receive an invoice from [employer]. Please contact [facility] at [phone] with questions or concerns regarding your invoice.   Our billing staff will not be able to assist you with questions regarding bills from these companies.  You will be contacted with the lab results as soon as they are available. The fastest way to get your results is to activate your My Chart account. Instructions are located on the last page of this paperwork. If you have not heard from [place] regarding the results in 2 weeks, please  contact this office.

## 2016-02-21 NOTE — Progress Notes (Addendum)
Patient ID: Pam Brown, female   DOB: 03/30/1976, 40 y.o.   MRN: [id number]  By signing my name below I, Tereasa Coop, attest that this documentation has been prepared under the direction and in the presence of Jaynee Eagles, [place]. Electonically Signed. Tereasa Coop, Scribe 02/21/2016 at 10:33 AM  MRN: [id number]  Subjective:   [name] is a 40 y.o. female presenting for annual physical exam and low abdominal cramping with menstrual periods.   Has not been seen by any provider since her last pregnancy in 2010.   Medical care team includes: PCP: none Vision: Not seen by eye Doctor. Denies blurred vision. Dental: Has not been seen by a dentist since 2010 OB/GYN: Not seen by OB/gyn since 2010.  Specialists: None  Has 4 sons, lives with her boyfriend. She is sexually active with a monogamous partner. Does not use condoms. Smokes one cigarette per week. Glass a wine once a week. Works as an Radiographer, therapeutic at [employer]. Going to school for a Masters. Pt does sit-ups and push-ups at home for exercise.   Dysmenorrhea - reports pelvic cramping with periods and irregular menstrual cycles since her last pregnancy in 2010. LMP started 5 days ago, still menstruating. Has had 1-2 cycles per month. Requesting birth control to help control abd cramping during her menstrual periods. Pt was on mirena prior to last pregnancy. Is agreeable to Depo or OCP.  Pam Brown does not have any active problems on his problem list.   Pam Brown  Current Outpatient Prescriptions:    ibuprofen (ADVIL,MOTRIN) 200 MG tablet, Take 200 mg by mouth every 8 (eight) hours as needed., Disp: , Rfl:    She has No Known Allergies.  Pam Brown  has a past medical history of Allergy. Also  has a past surgical history that includes Cesarean section. 4 C-sections.   Her family history includes Aneurysm in her maternal grandmother; Cancer in her maternal aunt and mother; Depression in her father; Diabetes in her  mother. Mother had HTN and passed away from non hodgkin's lymphoma. Has one Son who has asthma.   Immunizations:  Needs Tdap and flu immunization today.  ROS Knee pain, eczema. Patient would like to return to clinic on a different day for another office visit.  Objective:   Vitals: BP 112/68 (BP Location: Right Arm, Patient Position: Sitting, Cuff Size: Normal)    Pulse 67    Temp 98.7 F (37.1 C) (Oral)    Resp 17    Ht 5' (1.524 m)    Wt 132 lb (59.9 kg)    LMP 02/16/2016    SpO2 100%    BMI 25.78 kg/m   Physical Exam  Constitutional: She is oriented to person, place, and time. She appears well-developed and well-nourished.  HENT:  TM's intact bilaterally, no effusions or erythema. Nasal turbinates pink and moist, nasal passages patent. No sinus tenderness. Oropharynx clear, mucous membranes moist, dentition in good repair.  Eyes: Conjunctivae and EOM are normal. Pupils are equal, round, and reactive to light. Right eye exhibits no discharge. Left eye exhibits no discharge. No scleral icterus.  Neck: Normal range of motion. Neck supple. No thyromegaly present.  Cardiovascular: Normal rate, regular rhythm and intact distal pulses.  Exam reveals no gallop and no friction rub.   No murmur heard. Pulmonary/Chest: No respiratory distress. She has no wheezes. She has no rales.  Abdominal: Soft. Bowel sounds are normal. She exhibits no distension and no mass. There is no tenderness.  Genitourinary:  There is no rash, tenderness or lesion on the right labia. There is no rash, tenderness or lesion on the left labia. Uterus is not deviated, not enlarged and not tender. Cervix exhibits no motion tenderness, no discharge and no friability. Right adnexum displays no mass, no tenderness and no fullness. Left adnexum displays no mass, no tenderness and no fullness. There is bleeding (through vaginal canal) in the vagina. No erythema or tenderness in the vagina. No foreign body in the vagina. No signs of  injury around the vagina. No vaginal discharge found.  Musculoskeletal: Normal range of motion. She exhibits no edema or tenderness.  Lymphadenopathy:    She has no cervical adenopathy.  Neurological: She is alert and oriented to person, place, and time. She has normal reflexes.  Skin: Skin is warm and dry. No rash noted. No erythema. No pallor.  Psychiatric: She has a normal mood and affect.   Results for orders placed or performed in visit on 02/21/16 (from the past 24 hour(s))  POCT urine pregnancy     Status: None   Collection Time: 02/21/16 10:35 AM  Result Value Ref Range   Preg Test, Ur Negative Negative    Assessment and Plan :   1. Annual physical exam - Medically stable, labs pending. Discussed healthy lifestyle, diet, exercise, preventative care, vaccinations, and addressed patient's concerns.   2. Encounter for other general counseling or advice on contraception 3. Dysmenorrhea - Start Depo-Provera, rtc in 3 months for repeat injection. Counseled patient on worsening dysmenorrhea, consider referral to gynecology. Patient verbalized understanding.   4. Need for prophylactic vaccination and inoculation against influenza - Flu Vaccine QUAD 36+ mos IM  5. Need for Tdap vaccination - Tdap vaccine greater than or equal to 7yo IM   Jaynee Eagles, PA-C [facility] [phone] 02/21/2016  9:57 AM

## 2016-02-23 LAB — HIV ANTIBODY (ROUTINE TESTING W REFLEX): HIV 1&2 Ab, 4th Generation: NONREACTIVE

## 2016-02-24 LAB — PAP IG W/ RFLX HPV ASCU

## 2016-02-24 LAB — RPR

## 2016-02-25 ENCOUNTER — Encounter: Payer: Self-pay | Admitting: Urgent Care

## 2016-02-25 LAB — HUMAN PAPILLOMAVIRUS, HIGH RISK: HPV DNA HIGH RISK: NOT DETECTED

## 2016-04-23 ENCOUNTER — Ambulatory Visit (INDEPENDENT_AMBULATORY_CARE_PROVIDER_SITE_OTHER): Payer: BLUE CROSS/BLUE SHIELD | Admitting: Physician Assistant

## 2016-04-23 VITALS — BP 122/80 | HR 77 | Temp 98.2°F | Resp 18 | Ht 60.0 in | Wt 134.0 lb

## 2016-04-23 DIAGNOSIS — K529 Noninfective gastroenteritis and colitis, unspecified: Secondary | ICD-10-CM

## 2016-04-23 MED ORDER — ONDANSETRON 8 MG PO TBDP: 8.0000 mg | ORAL_TABLET | Freq: Three times a day (TID) | ORAL | 0 refills | Status: DC | PRN

## 2016-04-23 NOTE — Progress Notes (Signed)
[facility] [address] [place] [address] [phone]  Date:  04/23/2016   Name:  Pam Brown   DOB:  [birth year]   MRN:  [id number]  PCP:  No primary care provider on file.    History of Present Illness:  Pam Brown is a 40 y.o. female patient who presents to [facility] for cc of diarrhea, and vomiting.    She attempted to limit her intake.  He fixed chicken cassserole, woke up with diarrhea and vomiting.  3 episodes of explosive diarrhea.  Took in gingerale.  Headache.  No abdominal pain.  Non-bloody stool.  No recent antibiotic use.  No recent hospitalizations.  She had visited the hospital one week ago.  No fever.  1 episode vomiting.     There are no active problems to display for this patient.   Past Medical History:  Diagnosis Date  . Allergy     Past Surgical History:  Procedure Laterality Date  . CESAREAN SECTION     4    Social History  Substance Use Topics  . Smoking status: Never Smoker  . Smokeless tobacco: Never Used  . Alcohol use No    Family History  Problem Relation Age of Onset  . Cancer Mother   . Diabetes Mother   . Depression Father   . Cancer Maternal Aunt   . Aneurysm Maternal Grandmother     No Known Allergies  Medication list has been reviewed and updated.  Current Outpatient Prescriptions on File Prior to Visit  Medication Sig Dispense Refill  . ibuprofen (ADVIL,MOTRIN) 200 MG tablet Take 200 mg by mouth every 8 (eight) hours as needed.     No current facility-administered medications on file prior to visit.     ROS ROS otherwise unremarkable unless listed above.  Physical Examination: BP 122/80 (BP Location: Right Arm, Patient Position: Sitting, Cuff Size: Small)   Pulse 77   Temp 98.2 F (36.8 C) (Oral)   Resp 18   Ht 5' (1.524 m)   Wt 134 lb (60.8 kg)   SpO2 98%   BMI 26.17 kg/m  Ideal Body Weight: Weight in (lb) to have BMI = 25: 127.7  Physical Exam  Constitutional: She is  oriented to person, place, and time. She appears well-developed and well-nourished. No distress.  HENT:  Head: Normocephalic and atraumatic.  Right Ear: External ear normal.  Left Ear: External ear normal.  Eyes: Conjunctivae and EOM are normal. Pupils are equal, round, and reactive to light.  Cardiovascular: Normal rate.   Pulmonary/Chest: Effort normal. No respiratory distress.  Abdominal: Soft. Bowel sounds are normal. There is no hepatosplenomegaly. There is generalized tenderness. There is no tenderness at McBurney's point and negative Murphy's sign.  Neurological: She is alert and oriented to person, place, and time.  Skin: She is not diaphoretic.  Psychiatric: She has a normal mood and affect. Her behavior is normal.     Assessment and Plan: Pam Brown is a 40 y.o. female who is here today for diarrhea, and emesis. Given diarrhea friendly diet.   Anti-emetic rtc as needed. Gastroenteritis - Plan: ondansetron (ZOFRAN-ODT) 8 MG disintegrating tablet   Ivar Drape, PA-C [facility] 04/23/2016 12:10 PM

## 2016-04-23 NOTE — Patient Instructions (Addendum)
IF you received an x-ray today, you will receive an invoice from [facility]. Please contact [facility] at [phone] with questions or concerns regarding your invoice.   IF you received labwork today, you will receive an invoice from [facility]. Please contact [facility] at [phone] with questions or concerns regarding your invoice.   Our billing staff will not be able to assist you with questions regarding bills from these companies.  You will be contacted with the lab results as soon as they are available. The fastest way to get your results is to activate your My Chart account. Instructions are located on the last page of this paperwork. If you have not heard from [place] regarding the results in 2 weeks, please contact this office.     Food Choices to Help Relieve Diarrhea, Adult When you have diarrhea, the foods you eat and your eating habits are very important. Choosing the right foods and drinks can help relieve diarrhea. Also, because diarrhea can last up to 7 days, you need to replace lost fluids and electrolytes (such as sodium, potassium, and chloride) in order to help prevent dehydration. What general guidelines do I need to follow?  Slowly drink 1 cup (8 oz) of fluid for each episode of diarrhea. If you are getting enough fluid, your urine will be clear or pale yellow.  Eat starchy foods. Some good choices include white rice, white toast, pasta, low-fiber cereal, baked potatoes (without the skin), saltine crackers, and bagels.  Avoid large servings of any cooked vegetables.  Limit fruit to two servings per day. A serving is  cup or 1 small piece.  Choose foods with less than 2 g of fiber per serving.  Limit fats to less than 8 tsp (38 g) per day.  Avoid fried foods.  Eat foods that have probiotics in them. Probiotics can be found in certain dairy products.  Avoid foods and beverages that may increase the speed at which food moves through the  stomach and intestines (gastrointestinal tract). Things to avoid include:  High-fiber foods, such as dried fruit, raw fruits and vegetables, nuts, seeds, and whole grain foods.  Spicy foods and high-fat foods.  Foods and beverages sweetened with high-fructose corn syrup, honey, or sugar alcohols such as xylitol, sorbitol, and mannitol. What foods are recommended? Grains  White rice. White, [place], or pita breads (fresh or toasted), including plain rolls, buns, or bagels. White pasta. Saltine, soda, or graham crackers. Pretzels. Low-fiber cereal. Cooked cereals made with water (such as cornmeal, farina, or cream cereals). Plain muffins. Matzo. Melba toast. Zwieback. Vegetables  Potatoes (without the skin). Strained tomato and vegetable juices. Most well-cooked and canned vegetables without seeds. Tender lettuce. Fruits  Cooked or canned applesauce, apricots, cherries, fruit cocktail, grapefruit, peaches, pears, or plums. Fresh bananas, apples without skin, cherries, grapes, cantaloupe, grapefruit, peaches, oranges, or plums. Meat and Other Protein Products  Baked or boiled chicken. Eggs. Tofu. Fish. Seafood. Smooth peanut butter. Ground or well-cooked tender beef, ham, veal, lamb, pork, or poultry. Dairy  Plain yogurt, kefir, and unsweetened liquid yogurt. Lactose-free milk, buttermilk, or soy milk. Plain hard cheese. Beverages  Sport drinks. Clear broths. Diluted fruit juices (except prune). Regular, caffeine-free sodas such as ginger ale. Water. Decaffeinated teas. Oral rehydration solutions. Sugar-free beverages not sweetened with sugar alcohols. Other  Bouillon, broth, or soups made from recommended foods. The items listed above may not be a complete list of recommended foods or beverages. Contact your dietitian for more options.  What foods are not recommended? Grains  Whole grain, whole wheat, bran, or rye breads, rolls, pastas, crackers, and cereals. Wild or brown rice. Cereals that  contain more than 2 g of fiber per serving. Corn tortillas or taco shells. Cooked or dry oatmeal. Granola. Popcorn. Vegetables  Raw vegetables. Cabbage, broccoli, Brussels sprouts, artichokes, baked beans, beet greens, corn, kale, legumes, peas, sweet potatoes, and yams. Potato skins. Cooked spinach and cabbage. Fruits  Dried fruit, including raisins and dates. Raw fruits. Stewed or dried prunes. Fresh apples with skin, apricots, mangoes, pears, raspberries, and strawberries. Meat and Other Protein Products  Chunky peanut butter. Nuts and seeds. Beans and lentils. Berniece Salines. Dairy  High-fat cheeses. Milk, chocolate milk, and beverages made with milk, such as milk shakes. Cream. Ice cream. Sweets and Desserts  Sweet rolls, doughnuts, and sweet breads. Pancakes and waffles. Fats and Oils  Butter. Cream sauces. Margarine. Salad oils. Plain salad dressings. Olives. Avocados. Beverages  Caffeinated beverages (such as coffee, tea, soda, or energy drinks). Alcoholic beverages. Fruit juices with pulp. Prune juice. Soft drinks sweetened with high-fructose corn syrup or sugar alcohols. Other  Coconut. Hot sauce. Chili powder. Mayonnaise. Gravy. Cream-based or milk-based soups. The items listed above may not be a complete list of foods and beverages to avoid. Contact your dietitian for more information.  What should I do if I become dehydrated? Diarrhea can sometimes lead to dehydration. Signs of dehydration include dark urine and dry mouth and skin. If you think you are dehydrated, you should rehydrate with an oral rehydration solution. These solutions can be purchased at pharmacies, retail stores, or online. Drink -1 cup (120-240 mL) of oral rehydration solution each time you have an episode of diarrhea. If drinking this amount makes your diarrhea worse, try drinking smaller amounts more often. For example, drink 1-3 tsp (5-15 mL) every 5-10 minutes. A general rule for staying hydrated is to drink 1-2 L of  fluid per day. Talk to your health care provider about the specific amount you should be drinking each day. Drink enough fluids to keep your urine clear or pale yellow. This information is not intended to replace advice given to you by your health care provider. Make sure you discuss any questions you have with your health care provider. Document Released: 07/03/2003 Document Revised: 09/18/2015 Document Reviewed: 03/05/2013 Elsevier Interactive Patient Education  2017 [employer].

## 2016-06-02 ENCOUNTER — Ambulatory Visit: Payer: BLUE CROSS/BLUE SHIELD

## 2016-06-15 ENCOUNTER — Ambulatory Visit (INDEPENDENT_AMBULATORY_CARE_PROVIDER_SITE_OTHER): Payer: BLUE CROSS/BLUE SHIELD | Admitting: Family Medicine

## 2016-06-15 VITALS — BP 108/68 | HR 100 | Temp 98.8°F | Resp 16 | Wt 136.6 lb

## 2016-06-15 DIAGNOSIS — J111 Influenza due to unidentified influenza virus with other respiratory manifestations: Secondary | ICD-10-CM

## 2016-06-15 DIAGNOSIS — M545 Low back pain: Secondary | ICD-10-CM

## 2016-06-15 DIAGNOSIS — R197 Diarrhea, unspecified: Secondary | ICD-10-CM | POA: Diagnosis not present

## 2016-06-15 DIAGNOSIS — R69 Illness, unspecified: Secondary | ICD-10-CM

## 2016-06-15 NOTE — Patient Instructions (Addendum)
Your symptoms last week sound like possible influenza or influenza like illness. Your current symptoms also appear to be due to a virus.  If nausea returns, you can take the prescription of Zofran (call me if you need that refilled). Small sips of fluids frequently and slowly advance diet as tolerated. Out of work until fever free for 24 hours off of fever reducing medication. Return to the clinic or go to the nearest emergency room if any of your symptoms worsen or new symptoms occur.  Tylenol or motrin if needed for back pain and leg pain,  but if pain in back worsens or leg pains continue in next few weeks, would recommend returning for repeat exam and possible xrays.  See information below as these pains may be form a condition called sciatica.   Gastroenteritis:  Diarrhea Infections caused by germs (bacterial) or a virus commonly cause diarrhea. Your caregiver has determined that with time, rest and fluids, the diarrhea should improve. In general, eat normally while drinking more water than usual. Although water may prevent dehydration, it does not contain salt and minerals (electrolytes). Broths, weak tea without caffeine and oral rehydration solutions (ORS) replace fluids and electrolytes. Small amounts of fluids should be taken frequently. Large amounts at one time may not be tolerated. Plain water may be harmful in infants and the elderly. Oral rehydrating solutions (ORS) are available at pharmacies and grocery stores. ORS replace water and important electrolytes in proper proportions. Sports drinks are not as effective as ORS and may be harmful due to sugars worsening diarrhea.  ORS is especially recommended for use in children with diarrhea. As a general guideline for children, replace any new fluid losses from diarrhea and/or vomiting with ORS as follows:   If your child weighs 22 pounds or under (10 kg or less), give 60-120 mL ( -  cup or 2 - 4 ounces) of ORS for each episode of diarrheal  stool or vomiting episode.   If your child weighs more than 22 pounds (more than 10 kgs), give 120-240 mL ( - 1 cup or 4 - 8 ounces) of ORS for each diarrheal stool or episode of vomiting.   While correcting for dehydration, children should eat normally. However, foods high in sugar should be avoided because this may worsen diarrhea. Large amounts of carbonated soft drinks, juice, gelatin desserts and other highly sugared drinks should be avoided.   After correction of dehydration, other liquids that are appealing to the child may be added. Children should drink small amounts of fluids frequently and fluids should be increased as tolerated. Children should drink enough fluids to keep urine clear or pale yellow.   Adults should eat normally while drinking more fluids than usual. Drink small amounts of fluids frequently and increase as tolerated. Drink enough fluids to keep urine clear or pale yellow. Broths, weak decaffeinated tea, lemon lime soft drinks (allowed to go flat) and ORS replace fluids and electrolytes.   Avoid:   Carbonated drinks.   Juice.   Extremely hot or cold fluids.   Caffeine drinks.   Fatty, greasy foods.   Alcohol.   Tobacco.   Too much intake of anything at one time.   Gelatin desserts.   Probiotics are active cultures of beneficial bacteria. They may lessen the amount and number of diarrheal stools in adults. Probiotics can be found in yogurt with active cultures and in supplements.   Wash hands well to avoid spreading bacteria and virus.   Anti-diarrheal medications  are not recommended for infants and children.   Only take over-the-counter or prescription medicines for pain, discomfort or fever as directed by your caregiver. Do not give aspirin to children because it may cause Reye's Syndrome.   For adults, ask your caregiver if you should continue all prescribed and over-the-counter medicines.   If your caregiver has given you a follow-up appointment,  it is very important to keep that appointment. Not keeping the appointment could result in a chronic or permanent injury, and disability. If there is any problem keeping the appointment, you must call back to this facility for assistance.  SEEK IMMEDIATE MEDICAL CARE IF:   You or your child is unable to keep fluids down or other symptoms or problems become worse in spite of treatment.   Vomiting or diarrhea develops and becomes persistent.   There is vomiting of blood or bile (green material).   There is blood in the stool or the stools are black and tarry.   There is no urine output in 6-8 hours or there is only a small amount of very dark urine.   Abdominal pain develops, increases or localizes.   You have a fever.   Your baby is older than 3 months with a rectal temperature of 102 F (38.9 C) or higher.   Your baby is 82 months old or younger with a rectal temperature of 100.4 F (38 C) or higher.   You or your child develops excessive weakness, dizziness, fainting or extreme thirst.   You or your child develops a rash, stiff neck, severe headache or become irritable or sleepy and difficult to awaken.  MAKE SURE YOU:   Understand these instructions.   Will watch your condition.   Will get help right away if you are not doing well or get worse.  Document Released: 04/02/2002 Document Revised: 04/01/2011 Document Reviewed: 02/17/2009 [facility] Patient Information 2012 [place].  Nausea and Vomiting Nausea is a sick feeling that often comes before throwing up (vomiting). Vomiting is a reflex where stomach contents come out of your mouth. Vomiting can cause severe loss of body fluids (dehydration). Children and elderly adults can become dehydrated quickly, especially if they also have diarrhea. Nausea and vomiting are symptoms of a condition or disease. It is important to find the cause of your symptoms. CAUSES   Direct irritation of the stomach lining. This irritation can  result from increased acid production (gastroesophageal reflux disease), infection, food poisoning, taking certain medicines (such as nonsteroidal anti-inflammatory drugs), alcohol use, or tobacco use.   Signals from the brain.These signals could be caused by a headache, heat exposure, an inner ear disturbance, increased pressure in the brain from injury, infection, a tumor, or a concussion, pain, emotional stimulus, or metabolic problems.   An obstruction in the gastrointestinal tract (bowel obstruction).   Illnesses such as diabetes, hepatitis, gallbladder problems, appendicitis, kidney problems, cancer, sepsis, atypical symptoms of a heart attack, or eating disorders.   Medical treatments such as chemotherapy and radiation.   Receiving medicine that makes you sleep (general anesthetic) during surgery.  DIAGNOSIS Your caregiver may ask for tests to be done if the problems do not improve after a few days. Tests may also be done if symptoms are severe or if the reason for the nausea and vomiting is not clear. Tests may include:  Urine tests.   Blood tests.   Stool tests.   Cultures (to look for evidence of infection).   X-rays or other imaging studies.  Test results can help your caregiver make decisions about treatment or the need for additional tests. TREATMENT You need to stay well hydrated. Drink frequently but in small amounts.You may wish to drink water, sports drinks, clear broth, or eat frozen ice pops or gelatin dessert to help stay hydrated.When you eat, eating slowly may help prevent nausea.There are also some antinausea medicines that may help prevent nausea. HOME CARE INSTRUCTIONS   Take all medicine as directed by your caregiver.   If you do not have an appetite, do not force yourself to eat. However, you must continue to drink fluids.   If you have an appetite, eat a normal diet unless your caregiver tells you differently.   Eat a variety of complex carbohydrates  (rice, wheat, potatoes, bread), lean meats, yogurt, fruits, and vegetables.   Avoid high-fat foods because they are more difficult to digest.   Drink enough water and fluids to keep your urine clear or pale yellow.   If you are dehydrated, ask your caregiver for specific rehydration instructions. Signs of dehydration may include:   Severe thirst.   Dry lips and mouth.   Dizziness.   Dark urine.   Decreasing urine frequency and amount.   Confusion.   Rapid breathing or pulse.  SEEK IMMEDIATE MEDICAL CARE IF:   You have blood or brown flecks (like coffee grounds) in your vomit.   You have black or bloody stools.   You have a severe headache or stiff neck.   You are confused.   You have severe abdominal pain.   You have chest pain or trouble breathing.   You do not urinate at least once every 8 hours.   You develop cold or clammy skin.   You continue to vomit for longer than 24 to 48 hours.   You have a fever.  MAKE SURE YOU:   Understand these instructions.   Will watch your condition.   Will get help right away if you are not doing well or get worse.  Document Released: 04/12/2005 Document Revised: 04/01/2011 Document Reviewed: 09/09/2010 [facility] Patient Information 2012 [place], [place].  Return to the clinic or go to the nearest emergency room if any of your symptoms worsen or new symptoms occur.   Sciatica Sciatica is pain, numbness, weakness, or tingling along the path of the sciatic nerve. The sciatic nerve starts in the lower back and runs down the back of each leg. The nerve controls the muscles in the lower leg and in the back of the knee. It also provides feeling (sensation) to the back of the thigh, the lower leg, and the sole of the foot. Sciatica is a symptom of another medical condition that pinches or puts pressure on the sciatic nerve. Generally, sciatica only affects one side of the body. Sciatica usually goes away on its own or with treatment.  In some cases, sciatica may keep coming back (recur). What are the causes? This condition is caused by pressure on the sciatic nerve, or pinching of the sciatic nerve. This may be the result of:  A disk in between the bones of the spine (vertebrae) bulging out too far (herniated disk).  Age-related changes in the spinal disks (degenerative disk disease).  A pain disorder that affects a muscle in the buttock (piriformis syndrome).  Extra bone growth (bone spur) near the sciatic nerve.  An injury or break (fracture) of the pelvis.  Pregnancy.  Tumor (rare). What increases the risk? The following factors may make you more  likely to develop this condition:  Playing sports that place pressure or stress on the spine, such as football or weight lifting.  Having poor strength and flexibility.  A history of back injury.  A history of back surgery.  Sitting for long periods of time.  Doing activities that involve repetitive bending or lifting.  Obesity. What are the signs or symptoms? Symptoms can vary from mild to very severe, and they may include:  Any of these problems in the lower back, leg, hip, or buttock:  Mild tingling or dull aches.  Burning sensations.  Sharp pains.  Numbness in the back of the calf or the sole of the foot.  Leg weakness.  Severe back pain that makes movement difficult. These symptoms may get worse when you cough, sneeze, or laugh, or when you sit or stand for long periods of time. Being overweight may also make symptoms worse. In some cases, symptoms may recur over time. How is this diagnosed? This condition may be diagnosed based on:  Your symptoms.  A physical exam. Your health care provider may ask you to do certain movements to check whether those movements trigger your symptoms.  You may have tests, including:  Blood tests.  X-rays.  MRI.  CT scan. How is this treated? In many cases, this condition improves on its own, without  any treatment. However, treatment may include:  Reducing or modifying physical activity during periods of pain.  Exercising and stretching to strengthen your abdomen and improve the flexibility of your spine.  Icing and applying heat to the affected area.  Medicines that help:  To relieve pain and swelling.  To relax your muscles.  Injections of medicines that help to relieve pain, irritation, and inflammation around the sciatic nerve (steroids).  Surgery. Follow these instructions at home: Medicines  Take over-the-counter and prescription medicines only as told by your health care provider.  Do not drive or operate heavy machinery while taking prescription pain medicine. Managing pain  If directed, apply ice to the affected area.  Put ice in a plastic bag.  Place a towel between your skin and the bag.  Leave the ice on for 20 minutes, 2-3 times a day.  After icing, apply heat to the affected area before you exercise or as often as told by your health care provider. Use the heat source that your health care provider recommends, such as a moist heat pack or a heating pad.  Place a towel between your skin and the heat source.  Leave the heat on for 20-30 minutes.  Remove the heat if your skin turns bright red. This is especially important if you are unable to feel pain, heat, or cold. You may have a greater risk of getting burned. Activity  Return to your normal activities as told by your health care provider. Ask your health care provider what activities are safe for you.  Avoid activities that make your symptoms worse.  Take brief periods of rest throughout the day. Resting in a lying or standing position is usually better than sitting to rest.  When you rest for longer periods, mix in some mild activity or stretching between periods of rest. This will help to prevent stiffness and pain.  Avoid sitting for long periods of time without moving. Get up and move around  at least one time each hour.  Exercise and stretch regularly, as told by your health care provider.  Do not lift anything that is heavier than 10 lb (4.5  kg) while you have symptoms of sciatica. When you do not have symptoms, you should still avoid heavy lifting, especially repetitive heavy lifting.  When you lift objects, always use proper lifting technique, which includes:  Bending your knees.  Keeping the load close to your body.  Avoiding twisting. General instructions  Use good posture.  Avoid leaning forward while sitting.  Avoid hunching over while standing.  Maintain a healthy weight. Excess weight puts extra stress on your back and makes it difficult to maintain good posture.  Wear supportive, comfortable shoes. Avoid wearing high heels.  Avoid sleeping on a mattress that is too soft or too hard. A mattress that is firm enough to support your back when you sleep may help to reduce your pain.  Keep all follow-up visits as told by your health care provider. This is important. Contact a health care provider if:  You have pain that wakes you up when you are sleeping.  You have pain that gets worse when you lie down.  Your pain is worse than you have experienced in the past.  Your pain lasts longer than 4 weeks.  You experience unexplained weight loss. Get help right away if:  You lose control of your bowel or bladder (incontinence).  You have:  Weakness in your lower back, pelvis, buttocks, or legs that gets worse.  Redness or swelling of your back.  A burning sensation when you urinate. This information is not intended to replace advice given to you by your health care provider. Make sure you discuss any questions you have with your health care provider. Document Released: 04/06/2001 Document Revised: 09/16/2015 Document Reviewed: 12/20/2014 [employer] Interactive Patient Education  2017 [employer].   IF you received an x-ray today, you will receive an  invoice from [facility]. Please contact [facility] at [phone] with questions or concerns regarding your invoice.   IF you received labwork today, you will receive an invoice from [facility]. Please contact [facility] at [phone] with questions or concerns regarding your invoice.   Our billing staff will not be able to assist you with questions regarding bills from these companies.  You will be contacted with the lab results as soon as they are available. The fastest way to get your results is to activate your My Chart account. Instructions are located on the last page of this paperwork. If you have not heard from [place] regarding the results in 2 weeks, please contact this office.

## 2016-06-15 NOTE — Progress Notes (Signed)
Subjective:  By signing my name below, I, [name], attest that this documentation has been prepared under the direction and in the presence of Wendie Agreste, MD Electronically Signed: Ladene Artist, ED Scribe 06/15/2016 at 12:11 PM.   Patient ID: Pam Brown, female    DOB: 10-Feb-1976, 41 y.o.   MRN: [id number] Chief Complaint  Patient presents with  . Back Pain    lower   . Abdominal Pain  . Leg Pain    right   HPI Pam Brown is a 41 y.o. female who presents to Primary Care at [facility] complaining of watery diarrhea onset around 2 AM last night. She reports at least 7-8 episodes of diarrhea and 2 episodes last night but none this morning. Pt reports associated symptoms of fever with Tmax of 100 F this morning that resolved with Tylenol and 1 episode of vomiting that occurred around 6 AM after eating ground beef. She had a few saltine crackers, soup and water yesterday that she was able to keep down. Pt was seen in the office Dec 29 with a stomach bug and states that this feels similar. She also noticed an aching HA, productive cough, nasal congestion and back pain onset 1 week ago that she treated with Tylenol and ibuprofen. She denies blood in stools or difficulty urinating. No recent antibiotic use or foreign travel. Pt is unsure if she received a flu vaccine this season. 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 Pshx of 4 c-sections.   Back Pain Pt reports low back pain onset last week that she describes as a "twinge". She states that pain has improved with resting and applying Icy Hot to the area.   Right Leg Pain Pt reports intermittent, right aching leg pain for a few months that occurs while driving. She denies injury or falls. Pt also denies bladder/bowel incontinence, numbness/tingling.  There are no active problems to display for this patient.  Past Medical History:  Diagnosis Date    . Allergy    Past Surgical History:  Procedure Laterality Date  . CESAREAN SECTION     4   No Known Allergies Prior to Admission medications   Medication Sig Start Date End Date Taking? Authorizing Provider  acetaminophen (TYLENOL) 325 MG tablet Take 650 mg by mouth every 6 (six) hours as needed.   Yes Historical Provider, MD  ibuprofen (ADVIL,MOTRIN) 200 MG tablet Take 200 mg by mouth every 8 (eight) hours as needed.   Yes Historical Provider, MD   Social History   Social History  . Marital status: Single    Spouse name: N/A  . Number of children: 4  . Years of education: 23   Occupational History  . administrater     [employer]   Social History Main Topics  . Smoking status: Never Smoker  . Smokeless tobacco: Never Used  . Alcohol use No  . Drug use: No  . Sexual activity: Not on file   Other Topics Concern  . Not on file   Social History Narrative   Some sit ups sometimes while at work      Review of Systems  Constitutional: Positive for fever.  HENT: Positive for congestion.   Respiratory: Positive for cough.   Gastrointestinal: Positive for diarrhea and vomiting. Negative for blood in stool.  Genitourinary: Negative for difficulty urinating.  Musculoskeletal: Positive for back pain and myalgias.  Neurological: Positive for headaches. Negative for numbness.      Objective:   Physical Exam  Constitutional: She is oriented to person, place, and time. She appears well-developed and well-nourished. No distress.  HENT:  Head: Normocephalic and atraumatic.  Eyes: Conjunctivae and EOM are normal.  Neck: Neck supple. No tracheal deviation present.  Cardiovascular: Normal rate, regular rhythm and normal heart sounds.   Pulmonary/Chest: Effort normal and breath sounds normal. No respiratory distress.  Abdominal: Soft. Bowel sounds are normal. She exhibits no distension. There is no tenderness.  Musculoskeletal: Normal range of motion.  Lumbar  spine: no midline bony tenderness. No focal tenderness. Equal/FROM of lumbar spine. Negative seated straight leg raise. R hip: pain free ROM.   Neurological: She is alert and oriented to person, place, and time.  Reflex Scores:      Patellar reflexes are 2+ on the right side and 2+ on the left side.      Achilles reflexes are 2+ on the right side and 2+ on the left side. Skin: Skin is warm and dry.  Psychiatric: She has a normal mood and affect. Her behavior is normal.  Nursing note and vitals reviewed.  Vitals:   06/15/16 1132  BP: 108/68  Pulse: 100  Resp: 16  Temp: 98.8 F (37.1 C)  TempSrc: Oral  SpO2: 98%  Weight: 136 lb 9.6 oz (62 kg)      Assessment & Plan:    Pam Brown is a 41 y.o. female Influenza-like illness Diarrhea, unspecified type  - Improving, suspected viral illness. Abdomen was soft and nontender, afebrile at present. Previous symptoms may have been influenza or other viral illness. Appears well-hydrated, but oral rehydration discussed and Zofran if needed for nausea or vomiting. She should have some of that left over at home. RTC and contact precautions discussed.  Acute bilateral low back pain, with sciatica presence unspecified  - Mechanical back pain with possible component of sciatica by description. Reassuring exam, no red flags on exam or history.  -Tylenol over-the-counter or Motrin if needed for symptomatic care. Advised if pain persists in the next few weeks or any worsening, return for further evaluation and possible imaging.  Meds ordered this encounter  Medications  . acetaminophen (TYLENOL) 325 MG tablet    Sig: Take 650 mg by mouth every 6 (six) hours as needed.   Patient Instructions   Your symptoms last week sound like possible influenza or influenza like illness. Your current symptoms also appear to be due to a virus.  If nausea returns, you can take the prescription of Zofran (call me if you need that refilled). Small sips of fluids  frequently and slowly advance diet as tolerated. Out of work until fever free for 24 hours off of fever reducing medication. Return to the clinic or go to the nearest emergency room if any of your symptoms worsen or new symptoms occur.  Tylenol or motrin if needed for back pain and leg pain,  but if pain in back worsens or leg pains continue in next few weeks, would recommend returning for repeat exam and possible xrays.  See information below as these pains may be form a condition called sciatica.   Gastroenteritis:  Diarrhea Infections caused by germs (bacterial) or a virus commonly cause diarrhea. Your caregiver has determined that with time, rest and fluids, the diarrhea should improve. In general, eat normally while drinking more water than usual. Although water may prevent dehydration, it does not contain salt and minerals (  electrolytes). Broths, weak tea without caffeine and oral rehydration solutions (ORS) replace fluids and electrolytes. Small amounts of fluids should be taken frequently. Large amounts at one time may not be tolerated. Plain water may be harmful in infants and the elderly. Oral rehydrating solutions (ORS) are available at pharmacies and grocery stores. ORS replace water and important electrolytes in proper proportions. Sports drinks are not as effective as ORS and may be harmful due to sugars worsening diarrhea.  ORS is especially recommended for use in children with diarrhea. As a general guideline for children, replace any new fluid losses from diarrhea and/or vomiting with ORS as follows:   If your child weighs 22 pounds or under (10 kg or less), give 60-120 mL ( -  cup or 2 - 4 ounces) of ORS for each episode of diarrheal stool or vomiting episode.   If your child weighs more than 22 pounds (more than 10 kgs), give 120-240 mL ( - 1 cup or 4 - 8 ounces) of ORS for each diarrheal stool or episode of vomiting.   While correcting for dehydration, children should eat  normally. However, foods high in sugar should be avoided because this may worsen diarrhea. Large amounts of carbonated soft drinks, juice, gelatin desserts and other highly sugared drinks should be avoided.   After correction of dehydration, other liquids that are appealing to the child may be added. Children should drink small amounts of fluids frequently and fluids should be increased as tolerated. Children should drink enough fluids to keep urine clear or pale yellow.   Adults should eat normally while drinking more fluids than usual. Drink small amounts of fluids frequently and increase as tolerated. Drink enough fluids to keep urine clear or pale yellow. Broths, weak decaffeinated tea, lemon lime soft drinks (allowed to go flat) and ORS replace fluids and electrolytes.   Avoid:   Carbonated drinks.   Juice.   Extremely hot or cold fluids.   Caffeine drinks.   Fatty, greasy foods.   Alcohol.   Tobacco.   Too much intake of anything at one time.   Gelatin desserts.   Probiotics are active cultures of beneficial bacteria. They may lessen the amount and number of diarrheal stools in adults. Probiotics can be Brown in yogurt with active cultures and in supplements.   Wash hands well to avoid spreading bacteria and virus.   Anti-diarrheal medications are not recommended for infants and children.   Only take over-the-counter or prescription medicines for pain, discomfort or fever as directed by your caregiver. Do not give aspirin to children because it may cause Reye's Syndrome.   For adults, ask your caregiver if you should continue all prescribed and over-the-counter medicines.   If your caregiver has given you a follow-up appointment, it is very important to keep that appointment. Not keeping the appointment could result in a chronic or permanent injury, and disability. If there is any problem keeping the appointment, you must call back to this facility for assistance.  SEEK  IMMEDIATE MEDICAL CARE IF:   You or your child is unable to keep fluids down or other symptoms or problems become worse in spite of treatment.   Vomiting or diarrhea develops and becomes persistent.   There is vomiting of blood or bile (green material).   There is blood in the stool or the stools are black and tarry.   There is no urine output in 6-8 hours or there is only a small amount of very dark urine.  Abdominal pain develops, increases or localizes.   You have a fever.   Your baby is older than 3 months with a rectal temperature of 102 F (38.9 C) or higher.   Your baby is 55 months old or younger with a rectal temperature of 100.4 F (38 C) or higher.   You or your child develops excessive weakness, dizziness, fainting or extreme thirst.   You or your child develops a rash, stiff neck, severe headache or become irritable or sleepy and difficult to awaken.  MAKE SURE YOU:   Understand these instructions.   Will watch your condition.   Will get help right away if you are not doing well or get worse.  Document Released: 04/02/2002 Document Revised: 04/01/2011 Document Reviewed: 02/17/2009 [facility] Patient Information 2012 [place].  Nausea and Vomiting Nausea is a sick feeling that often comes before throwing up (vomiting). Vomiting is a reflex where stomach contents come out of your mouth. Vomiting can cause severe loss of body fluids (dehydration). Children and elderly adults can become dehydrated quickly, especially if they also have diarrhea. Nausea and vomiting are symptoms of a condition or disease. It is important to find the cause of your symptoms. CAUSES   Direct irritation of the stomach lining. This irritation can result from increased acid production (gastroesophageal reflux disease), infection, food poisoning, taking certain medicines (such as nonsteroidal anti-inflammatory drugs), alcohol use, or tobacco use.   Signals from the brain.These signals  could be caused by a headache, heat exposure, an inner ear disturbance, increased pressure in the brain from injury, infection, a tumor, or a concussion, pain, emotional stimulus, or metabolic problems.   An obstruction in the gastrointestinal tract (bowel obstruction).   Illnesses such as diabetes, hepatitis, gallbladder problems, appendicitis, kidney problems, cancer, sepsis, atypical symptoms of a heart attack, or eating disorders.   Medical treatments such as chemotherapy and radiation.   Receiving medicine that makes you sleep (general anesthetic) during surgery.  DIAGNOSIS Your caregiver may ask for tests to be done if the problems do not improve after a few days. Tests may also be done if symptoms are severe or if the reason for the nausea and vomiting is not clear. Tests may include:  Urine tests.   Blood tests.   Stool tests.   Cultures (to look for evidence of infection).   X-rays or other imaging studies.  Test results can help your caregiver make decisions about treatment or the need for additional tests. TREATMENT You need to stay well hydrated. Drink frequently but in small amounts.You may wish to drink water, sports drinks, clear broth, or eat frozen ice pops or gelatin dessert to help stay hydrated.When you eat, eating slowly may help prevent nausea.There are also some antinausea medicines that may help prevent nausea. HOME CARE INSTRUCTIONS   Take all medicine as directed by your caregiver.   If you do not have an appetite, do not force yourself to eat. However, you must continue to drink fluids.   If you have an appetite, eat a normal diet unless your caregiver tells you differently.   Eat a variety of complex carbohydrates (rice, wheat, potatoes, bread), lean meats, yogurt, fruits, and vegetables.   Avoid high-fat foods because they are more difficult to digest.   Drink enough water and fluids to keep your urine clear or pale yellow.   If you are  dehydrated, ask your caregiver for specific rehydration instructions. Signs of dehydration may include:   Severe thirst.   Dry  lips and mouth.   Dizziness.   Dark urine.   Decreasing urine frequency and amount.   Confusion.   Rapid breathing or pulse.  SEEK IMMEDIATE MEDICAL CARE IF:   You have blood or brown flecks (like coffee grounds) in your vomit.   You have black or bloody stools.   You have a severe headache or stiff neck.   You are confused.   You have severe abdominal pain.   You have chest pain or trouble breathing.   You do not urinate at least once every 8 hours.   You develop cold or clammy skin.   You continue to vomit for longer than 24 to 48 hours.   You have a fever.  MAKE SURE YOU:   Understand these instructions.   Will watch your condition.   Will get help right away if you are not doing well or get worse.  Document Released: 04/12/2005 Document Revised: 04/01/2011 Document Reviewed: 09/09/2010 [facility] Patient Information 2012 [place], [place].  Return to the clinic or go to the nearest emergency room if any of your symptoms worsen or new symptoms occur.   Sciatica Sciatica is pain, numbness, weakness, or tingling along the path of the sciatic nerve. The sciatic nerve starts in the lower back and runs down the back of each leg. The nerve controls the muscles in the lower leg and in the back of the knee. It also provides feeling (sensation) to the back of the thigh, the lower leg, and the sole of the foot. Sciatica is a symptom of another medical condition that pinches or puts pressure on the sciatic nerve. Generally, sciatica only affects one side of the body. Sciatica usually goes away on its own or with treatment. In some cases, sciatica may keep coming back (recur). What are the causes? This condition is caused by pressure on the sciatic nerve, or pinching of the sciatic nerve. This may be the result of:  A disk in between the bones of the  spine (vertebrae) bulging out too far (herniated disk).  Age-related changes in the spinal disks (degenerative disk disease).  A pain disorder that affects a muscle in the buttock (piriformis syndrome).  Extra bone growth (bone spur) near the sciatic nerve.  An injury or break (fracture) of the pelvis.  Pregnancy.  Tumor (rare). What increases the risk? The following factors may make you more likely to develop this condition:  Playing sports that place pressure or stress on the spine, such as football or weight lifting.  Having poor strength and flexibility.  A history of back injury.  A history of back surgery.  Sitting for long periods of time.  Doing activities that involve repetitive bending or lifting.  Obesity. What are the signs or symptoms? Symptoms can vary from mild to very severe, and they may include:  Any of these problems in the lower back, leg, hip, or buttock:  Mild tingling or dull aches.  Burning sensations.  Sharp pains.  Numbness in the back of the calf or the sole of the foot.  Leg weakness.  Severe back pain that makes movement difficult. These symptoms may get worse when you cough, sneeze, or laugh, or when you sit or stand for long periods of time. Being overweight may also make symptoms worse. In some cases, symptoms may recur over time. How is this diagnosed? This condition may be diagnosed based on:  Your symptoms.  A physical exam. Your health care provider may ask you to do certain  movements to check whether those movements trigger your symptoms.  You may have tests, including:  Blood tests.  X-rays.  MRI.  CT scan. How is this treated? In many cases, this condition improves on its own, without any treatment. However, treatment may include:  Reducing or modifying physical activity during periods of pain.  Exercising and stretching to strengthen your abdomen and improve the flexibility of your spine.  Icing and applying  heat to the affected area.  Medicines that help:  To relieve pain and swelling.  To relax your muscles.  Injections of medicines that help to relieve pain, irritation, and inflammation around the sciatic nerve (steroids).  Surgery. Follow these instructions at home: Medicines  Take over-the-counter and prescription medicines only as told by your health care provider.  Do not drive or operate heavy machinery while taking prescription pain medicine. Managing pain  If directed, apply ice to the affected area.  Put ice in a plastic bag.  Place a towel between your skin and the bag.  Leave the ice on for 20 minutes, 2-3 times a day.  After icing, apply heat to the affected area before you exercise or as often as told by your health care provider. Use the heat source that your health care provider recommends, such as a moist heat pack or a heating pad.  Place a towel between your skin and the heat source.  Leave the heat on for 20-30 minutes.  Remove the heat if your skin turns bright red. This is especially important if you are unable to feel pain, heat, or cold. You may have a greater risk of getting burned. Activity  Return to your normal activities as told by your health care provider. Ask your health care provider what activities are safe for you.  Avoid activities that make your symptoms worse.  Take brief periods of rest throughout the day. Resting in a lying or standing position is usually better than sitting to rest.  When you rest for longer periods, mix in some mild activity or stretching between periods of rest. This will help to prevent stiffness and pain.  Avoid sitting for long periods of time without moving. Get up and move around at least one time each hour.  Exercise and stretch regularly, as told by your health care provider.  Do not lift anything that is heavier than 10 lb (4.5 kg) while you have symptoms of sciatica. When you do not have symptoms, you  should still avoid heavy lifting, especially repetitive heavy lifting.  When you lift objects, always use proper lifting technique, which includes:  Bending your knees.  Keeping the load close to your body.  Avoiding twisting. General instructions  Use good posture.  Avoid leaning forward while sitting.  Avoid hunching over while standing.  Maintain a healthy weight. Excess weight puts extra stress on your back and makes it difficult to maintain good posture.  Wear supportive, comfortable shoes. Avoid wearing high heels.  Avoid sleeping on a mattress that is too soft or too hard. A mattress that is firm enough to support your back when you sleep may help to reduce your pain.  Keep all follow-up visits as told by your health care provider. This is important. Contact a health care provider if:  You have pain that wakes you up when you are sleeping.  You have pain that gets worse when you lie down.  Your pain is worse than you have experienced in the past.  Your pain lasts longer  than 4 weeks.  You experience unexplained weight loss. Get help right away if:  You lose control of your bowel or bladder (incontinence).  You have:  Weakness in your lower back, pelvis, buttocks, or legs that gets worse.  Redness or swelling of your back.  A burning sensation when you urinate. This information is not intended to replace advice given to you by your health care provider. Make sure you discuss any questions you have with your health care provider. Document Released: 04/06/2001 Document Revised: 09/16/2015 Document Reviewed: 12/20/2014 Elsevier Interactive Patient Education  2017 [employer].   IF you received an x-ray today, you will receive an invoice from [facility]. Please contact [facility] at [phone] with questions or concerns regarding your invoice.   IF you received labwork today, you will receive an invoice from [facility]. Please contact  [facility] at [phone] with questions or concerns regarding your invoice.   Our billing staff will not be able to assist you with questions regarding bills from these companies.  You will be contacted with the lab results as soon as they are available. The fastest way to get your results is to activate your My Chart account. Instructions are located on the last page of this paperwork. If you have not heard from [place] regarding the results in 2 weeks, please contact this office.      I personally performed the services described in this documentation, which was scribed in my presence. The recorded information has been reviewed and considered for accuracy and completeness, addended by me as needed, and agree with information above.  Signed,   Merri Ray, MD [facility] at [place].  06/16/16 11:21 PM

## 2017-02-05 ENCOUNTER — Ambulatory Visit (INDEPENDENT_AMBULATORY_CARE_PROVIDER_SITE_OTHER): Payer: BLUE CROSS/BLUE SHIELD | Admitting: Physician Assistant

## 2017-02-05 ENCOUNTER — Encounter: Payer: Self-pay | Admitting: Physician Assistant

## 2017-02-05 VITALS — BP 110/70 | HR 71 | Temp 98.5°F | Resp 16 | Ht 60.25 in | Wt 143.2 lb

## 2017-02-05 DIAGNOSIS — Z23 Encounter for immunization: Secondary | ICD-10-CM | POA: Diagnosis not present

## 2017-02-05 DIAGNOSIS — Z309 Encounter for contraceptive management, unspecified: Secondary | ICD-10-CM

## 2017-02-05 LAB — POCT URINE PREGNANCY: Preg Test, Ur: NEGATIVE

## 2017-02-05 MED ORDER — MEDROXYPROGESTERONE ACETATE 150 MG/ML IM SUSY: 150.0000 mg | PREFILLED_SYRINGE | Freq: Once | INTRAMUSCULAR | Status: DC

## 2017-02-05 MED ORDER — MEDROXYPROGESTERONE ACETATE 150 MG/ML IM SUSY
150.0000 mg | PREFILLED_SYRINGE | Freq: Once | INTRAMUSCULAR | Status: AC
  Administered 2017-02-05: 150 mg via INTRAMUSCULAR

## 2017-02-05 NOTE — Patient Instructions (Addendum)
Return every 3 months (13 weeks) for repeat injections.  If DMPA is initiated more than seven days after the first day of the woman's menstrual period and she has had unprotected intercourse during the cycle, clinicians and patients must recognize that there is a small chance of preimplantation or early pregnancy despite a negative pregnancy test prior to the injection. We suggest these women receive emergency contraception if intercourse occurred within the previous 120 hours, and we advise them to use back-up contraception for seven days after DMPA injection since ovulation may occur within 24 hours of the initial injection, and we counsel them to have a repeat pregnancy test in two to three weeks.  Medroxyprogesterone injection [Contraceptive] What is this medicine? MEDROXYPROGESTERONE (me DROX ee proe JES te rone) contraceptive injections prevent pregnancy. They provide effective birth control for 3 months. Depo-subQ Provera 104 is also used for treating pain related to endometriosis. This medicine may be used for other purposes; ask your health care provider or pharmacist if you have questions. COMMON BRAND NAME(S): Depo-Provera, Depo-subQ Provera 104 What should I tell my health care provider before I take this medicine? They need to know if you have any of these conditions: -frequently drink alcohol -asthma -blood vessel disease or a history of a blood clot in the lungs or legs -bone disease such as osteoporosis -breast cancer -diabetes -eating disorder (anorexia nervosa or bulimia) -high blood pressure -HIV infection or AIDS -kidney disease -liver disease -mental depression -migraine -seizures (convulsions) -stroke -tobacco smoker -vaginal bleeding -an unusual or allergic reaction to medroxyprogesterone, other hormones, medicines, foods, dyes, or preservatives -pregnant or trying to get pregnant -breast-feeding How should I use this medicine? Depo-Provera Contraceptive  injection is given into a muscle. Depo-subQ Provera 104 injection is given under the skin. These injections are given by a health care professional. You must not be pregnant before getting an injection. The injection is usually given during the first 5 days after the start of a menstrual period or 6 weeks after delivery of a baby. Talk to your pediatrician regarding the use of this medicine in children. Special care may be needed. These injections have been used in female children who have started having menstrual periods. Overdosage: If you think you have taken too much of this medicine contact a poison control center or emergency room at once. NOTE: This medicine is only for you. Do not share this medicine with others. What if I miss a dose? Try not to miss a dose. You must get an injection once every 3 months to maintain birth control. If you cannot keep an appointment, call and reschedule it. If you wait longer than 13 weeks between Depo-Provera contraceptive injections or longer than 14 weeks between Depo-subQ Provera 104 injections, you could get pregnant. Use another method for birth control if you miss your appointment. You may also need a pregnancy test before receiving another injection. What may interact with this medicine? Do not take this medicine with any of the following medications: -bosentan This medicine may also interact with the following medications: -aminoglutethimide -antibiotics or medicines for infections, especially rifampin, rifabutin, rifapentine, and griseofulvin -aprepitant -barbiturate medicines such as phenobarbital or primidone -bexarotene -carbamazepine -medicines for seizures like ethotoin, felbamate, oxcarbazepine, phenytoin, topiramate -modafinil -St. John's wort This list may not describe all possible interactions. Give your health care provider a list of all the medicines, herbs, non-prescription drugs, or dietary supplements you use. Also tell them if you  smoke, drink alcohol, or use illegal drugs. Some  items may interact with your medicine. What should I watch for while using this medicine? This drug does not protect you against HIV infection (AIDS) or other sexually transmitted diseases. Use of this product may cause you to lose calcium from your bones. Loss of calcium may cause weak bones (osteoporosis). Only use this product for more than 2 years if other forms of birth control are not right for you. The longer you use this product for birth control the more likely you will be at risk for weak bones. Ask your health care professional how you can keep strong bones. You may have a change in bleeding pattern or irregular periods. Many females stop having periods while taking this drug. If you have received your injections on time, your chance of being pregnant is very low. If you think you may be pregnant, see your health care professional as soon as possible. Tell your health care professional if you want to get pregnant within the next year. The effect of this medicine may last a long time after you get your last injection. What side effects may I notice from receiving this medicine? Side effects that you should report to your doctor or health care professional as soon as possible: -allergic reactions like skin rash, itching or hives, swelling of the face, lips, or tongue -breast tenderness or discharge -breathing problems -changes in vision -depression -feeling faint or lightheaded, falls -fever -pain in the abdomen, chest, groin, or leg -problems with balance, talking, walking -unusually weak or tired -yellowing of the eyes or skin Side effects that usually do not require medical attention (report to your doctor or health care professional if they continue or are bothersome): -acne -fluid retention and swelling -headache -irregular periods, spotting, or absent periods -temporary pain, itching, or skin reaction at site where  injected -weight gain This list may not describe all possible side effects. Call your doctor for medical advice about side effects. You may report side effects to FDA at 1-800-FDA-1088. Where should I keep my medicine? This does not apply. The injection will be given to you by a health care professional. NOTE: This sheet is a summary. It may not cover all possible information. If you have questions about this medicine, talk to your doctor, pharmacist, or health care provider.  2018 Elsevier/Gold Standard (2008-05-03 18:37:56)  IF you received an x-ray today, you will receive an invoice from [facility]. Please contact [facility] at [phone] with questions or concerns regarding your invoice.   IF you received labwork today, you will receive an invoice from [facility]. Please contact [facility] at [phone] with questions or concerns regarding your invoice.   Our billing staff will not be able to assist you with questions regarding bills from these companies.  You will be contacted with the lab results as soon as they are available. The fastest way to get your results is to activate your My Chart account. Instructions are located on the last page of this paperwork. If you have not heard from [place] regarding the results in 2 weeks, please contact this office.

## 2017-02-05 NOTE — Progress Notes (Signed)
   CHRISTEAN SILVESTRI  MRN: [id number] DOB: [birth year]  PCP: Jaynee Eagles, PA-C  Subjective:  Pt is a 41 year old female who presents to clinic for birth control management. She would like to get back on depo injections. She has not had an injection since Jan 2018 due to lack of f/u. 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  LMP 2 weeks ago. Last coitus 2 days ago. She is currently using the "pull out method".  No h/o DM.  Non smoker.  No h/o blood clots.  No h/o breast cancer.   Review of Systems  Constitutional: Negative for chills, diaphoresis, fatigue, fever and unexpected weight change.  Genitourinary: Negative for dysuria, menstrual problem, vaginal bleeding and vaginal pain.    There are no active problems to display for this patient.   Current Outpatient Prescriptions on File Prior to Visit  Medication Sig Dispense Refill  . acetaminophen (TYLENOL) 325 MG tablet Take 650 mg by mouth every 6 (six) hours as needed.    Marland Kitchen ibuprofen (ADVIL,MOTRIN) 200 MG tablet Take 200 mg by mouth every 8 (eight) hours as needed.     No current facility-administered medications on file prior to visit.     No Known Allergies   Objective:  There were no vitals taken for this visit.  Physical Exam  Constitutional: She is oriented to person, place, and time and well-developed, well-nourished, and in no distress. No distress.  Cardiovascular: Normal rate, regular rhythm and normal heart sounds.   Neurological: She is alert and oriented to person, place, and time. GCS score is 15.  Skin: Skin is warm and dry.  Psychiatric: Mood, memory, affect and judgment normal.  Vitals reviewed.   Results for orders placed or performed in visit on 02/05/17  POCT urine pregnancy  Result Value Ref Range   Preg Test, Ur Negative Negative    Assessment and Plan :  1. Encounter for contraceptive management, unspecified type - POCT urine pregnancy - MedroxyPROGESTERone Acetate SUSY 150 mg;  Inject 1 mL (150 mg total) into the muscle once. - Pt declines to come back for injection within 7 days of next period due to busy schedule. Will administer today.  She understands possibility of current pregnancy, despite negative POCT hcg. Advised pt to use back-up method x 1 week. Repeat pregnancy test in 2-3 weeks. She understands and agrees. RTC in 3 months for repeat injection. 2. Flu vaccine need - Flu Vaccine QUAD 36+ mos IM    Mercer Pod, PA-C  [facility] 02/05/2017 8:20 AM

## 2017-04-28 ENCOUNTER — Other Ambulatory Visit: Payer: Self-pay

## 2017-04-28 ENCOUNTER — Ambulatory Visit (INDEPENDENT_AMBULATORY_CARE_PROVIDER_SITE_OTHER): Payer: BLUE CROSS/BLUE SHIELD | Admitting: Family Medicine

## 2017-04-28 ENCOUNTER — Encounter: Payer: Self-pay | Admitting: Family Medicine

## 2017-04-28 VITALS — BP 122/64 | HR 87 | Temp 98.6°F | Ht 62.6 in | Wt 140.8 lb

## 2017-04-28 DIAGNOSIS — N760 Acute vaginitis: Secondary | ICD-10-CM

## 2017-04-28 DIAGNOSIS — A5901 Trichomonal vulvovaginitis: Secondary | ICD-10-CM | POA: Diagnosis not present

## 2017-04-28 DIAGNOSIS — Z113 Encounter for screening for infections with a predominantly sexual mode of transmission: Secondary | ICD-10-CM | POA: Diagnosis not present

## 2017-04-28 DIAGNOSIS — B9689 Other specified bacterial agents as the cause of diseases classified elsewhere: Secondary | ICD-10-CM | POA: Diagnosis not present

## 2017-04-28 DIAGNOSIS — N898 Other specified noninflammatory disorders of vagina: Secondary | ICD-10-CM

## 2017-04-28 LAB — POCT WET + KOH PREP
Yeast by KOH: ABSENT
Yeast by wet prep: ABSENT

## 2017-04-28 MED ORDER — METRONIDAZOLE 500 MG PO TABS: 500.0000 mg | ORAL_TABLET | Freq: Two times a day (BID) | ORAL | 0 refills | Status: DC

## 2017-04-28 NOTE — Progress Notes (Signed)
1/3/20198:46 AM  Pam Brown 1976/04/12, 41 y.o. female [id number]  Chief Complaint  Patient presents with  . Vaginitis    BACTERIAL VAGINOSIS, UNCOMFORTABLE INTERCOURSE, ITCHING    HPI:   Patient is a 42 y.o. female who presents today for several days of uncomfortable intercourse, vaginal itchiness, has not noticed a discharge nor odor but she is having irregular spotting as she started depo. She denies any new irritants. She denies any dysuria. She does not douche. She had BV with all her pregnancies. She denies any fever, chills, abd pain, nausea, flank pain.   Depression screen [facility] 2/9 04/28/2017 02/05/2017 06/15/2016  Decreased Interest 0 0 0  Down, Depressed, Hopeless 0 0 0  PHQ - 2 Score 0 0 0    No Known Allergies  Prior to Admission medications   Not on File    Past Medical History:  Diagnosis Date  . Allergy     Past Surgical History:  Procedure Laterality Date  . CESAREAN SECTION     4    Social History   Tobacco Use  . Smoking status: Never Smoker  . Smokeless tobacco: Never Used  Substance Use Topics  . Alcohol use: No    Family History  Problem Relation Age of Onset  . Cancer Mother   . Diabetes Mother   . Depression Father   . Cancer Maternal Aunt   . Aneurysm Maternal Grandmother     ROS Per hpi  OBJECTIVE:  Blood pressure 122/64, pulse 87, temperature 98.6 F (37 C), temperature source Oral, height 5' 2.6" (1.59 m), weight 140 lb 12.8 oz (63.9 kg), SpO2 98 %.  Physical Exam  Constitutional: She is oriented to person, place, and time and well-developed, well-nourished, and in no distress.  HENT:  Head: Normocephalic and atraumatic.  Right Ear: Hearing, tympanic membrane, external ear and ear canal normal.  Left Ear: Hearing, tympanic membrane, external ear and ear canal normal.  Mouth/Throat: Oropharynx is clear and moist.  Eyes: EOM are normal. Pupils are equal, round, and reactive to light.  Neck: Neck supple. No  thyromegaly present.  Cardiovascular: Normal rate, regular rhythm, normal heart sounds and intact distal pulses. Exam reveals no gallop and no friction rub.  No murmur heard. Pulmonary/Chest: Effort normal and breath sounds normal. She has no wheezes. She has no rales.  Abdominal: Soft. Bowel sounds are normal. She exhibits no distension and no mass. There is no tenderness.  Musculoskeletal: Normal range of motion. She exhibits no edema.  Lymphadenopathy:    She has no cervical adenopathy.  Neurological: She is alert and oriented to person, place, and time. She has normal reflexes. Gait normal.  Skin: Skin is warm and dry.  Psychiatric: Mood and affect normal.  Nursing note and vitals reviewed.     Results for orders placed or performed in visit on 04/28/17 (from the past 24 hour(s))  POCT Wet + KOH Prep     Status: Abnormal   Collection Time: 04/28/17  9:04 AM  Result Value Ref Range   Yeast by KOH Absent Absent   Yeast by wet prep Absent Absent   WBC by wet prep Few Few   Clue Cells Wet Prep HPF POC Moderate (A) None   Trich by wet prep Present (A) Absent   Bacteria Wet Prep HPF POC Many (A) Few   Epithelial Cells By [employer] Pref ([facility]) None None, Few, Too numerous to count   RBC,UR,HPF,POC None None RBC/hpf     ASSESSMENT  and PLAN  1. Bacterial vaginosis Discussed supportive measures, new meds r/se/b and RTC precautions.  2. Trichomonas vaginalis (TV) infection Discussed routine STD prevention, importance of partner notification and treatment, patient felt comfortable with this. No sexual intercourse until 7 days after treatment.   3. Itching in the vaginal area - POCT Wet + KOH Prep  4. Screening examination for STD (sexually transmitted disease) - GC/Chlamydia Probe Amp - HIV antibody (with reflex) - RPR  Other orders - metroNIDAZOLE (FLAGYL) 500 MG tablet; Take 1 tablet (500 mg total) by mouth 2 (two) times daily.  Return if symptoms worsen or fail to  improve.    Rutherford Guys, MD [facility] at [facility] [place], [place] [address] Ph.  [phone] Fax [phone]

## 2017-04-28 NOTE — Patient Instructions (Signed)
     IF you received an x-ray today, you will receive an invoice from [facility]. Please contact [facility] at [phone] with questions or concerns regarding your invoice.   IF you received labwork today, you will receive an invoice from [facility]. Please contact [facility] at [phone] with questions or concerns regarding your invoice.   Our billing staff will not be able to assist you with questions regarding bills from these companies.  You will be contacted with the lab results as soon as they are available. The fastest way to get your results is to activate your My Chart account. Instructions are located on the last page of this paperwork. If you have not heard from us regarding the results in 2 weeks, please contact this office.     

## 2017-04-29 LAB — RPR: RPR Ser Ql: NONREACTIVE

## 2017-04-29 LAB — HIV ANTIBODY (ROUTINE TESTING W REFLEX): HIV Screen 4th Generation wRfx: NONREACTIVE

## 2017-07-21 ENCOUNTER — Emergency Department: Admission: EM | Admit: 2017-07-21 | Payer: Self-pay | Source: Home / Self Care

## 2017-09-20 ENCOUNTER — Encounter: Payer: Self-pay | Admitting: Family Medicine

## 2017-09-20 ENCOUNTER — Ambulatory Visit (INDEPENDENT_AMBULATORY_CARE_PROVIDER_SITE_OTHER): Payer: BLUE CROSS/BLUE SHIELD | Admitting: Family Medicine

## 2017-09-20 ENCOUNTER — Other Ambulatory Visit: Payer: Self-pay

## 2017-09-20 VITALS — BP 126/80 | HR 81 | Temp 98.8°F | Ht 62.0 in | Wt 142.6 lb

## 2017-09-20 DIAGNOSIS — Z309 Encounter for contraceptive management, unspecified: Secondary | ICD-10-CM | POA: Diagnosis not present

## 2017-09-20 DIAGNOSIS — K529 Noninfective gastroenteritis and colitis, unspecified: Secondary | ICD-10-CM

## 2017-09-20 LAB — POCT URINE PREGNANCY: Preg Test, Ur: NEGATIVE

## 2017-09-20 MED ORDER — MEDROXYPROGESTERONE ACETATE 150 MG/ML IM SUSY
150.0000 mg | PREFILLED_SYRINGE | INTRAMUSCULAR | Status: AC
Start: 2017-09-20 — End: 2018-09-15
  Administered 2017-09-20: 150 mg via INTRAMUSCULAR

## 2017-09-20 NOTE — Patient Instructions (Addendum)
1. Next depo injection due Aug 13-27, nurse visit OK.    IF you received an x-ray today, you will receive an invoice from [facility]. Please contact [facility] at [phone] with questions or concerns regarding your invoice.   IF you received labwork today, you will receive an invoice from [facility]. Please contact [facility] at [phone] with questions or concerns regarding your invoice.   Our billing staff will not be able to assist you with questions regarding bills from these companies.  You will be contacted with the lab results as soon as they are available. The fastest way to get your results is to activate your My Chart account. Instructions are located on the last page of this paperwork. If you have not heard from [place] regarding the results in 2 weeks, please contact this office.     Diarrhea, Adult Diarrhea is when you have loose and water poop (stool) often. Diarrhea can make you feel weak and cause you to get dehydrated. Dehydration can make you tired and thirsty, make you have a dry mouth, and make it so you pee (urinate) less often. Diarrhea often lasts 2-3 days. However, it can last longer if it is a sign of something more serious. It is important to treat your diarrhea as told by your doctor. Follow these instructions at home: Eating and drinking  Follow these recommendations as told by your doctor:  Take an oral rehydration solution (ORS). This is a drink that is sold at pharmacies and stores.  Drink clear fluids, such as: ? Water. ? Ice chips. ? Diluted fruit juice. ? Low-calorie sports drinks.  Eat bland, easy-to-digest foods in small amounts as you are able. These foods include: ? Bananas. ? Applesauce. ? Rice. ? Low-fat (lean) meats. ? Toast. ? Crackers.  Avoid drinking fluids that have a lot of sugar or caffeine in them.  Avoid alcohol.  Avoid spicy or fatty foods.  General instructions   Drink enough fluid to keep your pee  (urine) clear or pale yellow.  Wash your hands often. If you cannot use soap and water, use hand sanitizer.  Make sure that all people in your home wash their hands well and often.  Take over-the-counter and prescription medicines only as told by your doctor.  Rest at home while you get better.  Watch your condition for any changes.  Take a warm bath to help with any burning or pain from having diarrhea.  Keep all follow-up visits as told by your doctor. This is important. Contact a doctor if:  You have a fever.  Your diarrhea gets worse.  You have new symptoms.  You cannot keep fluids down.  You feel light-headed or dizzy.  You have a headache.  You have muscle cramps. Get help right away if:  You have chest pain.  You feel very weak or you pass out (faint).  You have bloody or black poop or poop that look like tar.  You have very bad pain, cramping, or bloating in your belly (abdomen).  You have trouble breathing or you are breathing very quickly.  Your heart is beating very quickly.  Your skin feels cold and clammy.  You feel confused.  You have signs of dehydration, such as: ? Dark pee, hardly any pee, or no pee. ? Cracked lips. ? Dry mouth. ? Sunken eyes. ? Sleepiness. ? Weakness. This information is not intended to replace advice given to you by your health care provider. Make sure you discuss any questions  you have with your health care provider. Document Released: 09/29/2007 Document Revised: 10/31/2015 Document Reviewed: 12/17/2014 Elsevier Interactive Patient Education  2018 [employer].

## 2017-09-20 NOTE — Progress Notes (Signed)
5/28/201911:26 AM  Pam Brown 1975-05-24, 42 y.o. female [id number]  Chief Complaint  Patient presents with  . Nausea    feelling nausous and having diarrhea since Saturday. Thinks she may have eaten something that gave these symptoms. Also would want to get bk on the depo shot to help with heavy periods    HPI:   Patient is a 42 y.o. female who presents today for watery diarrhea that intensified this morning. She had been feeling queasy for past several days but then in the middle of the night had to wake up several times due to explosive watery diarrhea Denies any fever, chills, vomiting, bloody diarrhea Her son had GI bug last week She works in childcare Has been tolerating fluids well, reports normal UOP  She would also like to restart depo, has done well in the past  Fall Risk  09/20/2017 04/28/2017 02/05/2017 06/15/2016  Falls in the past year? No No No No     Depression screen [facility] 2/9 09/20/2017 04/28/2017 02/05/2017  Decreased Interest 0 0 0  Down, Depressed, Hopeless 0 0 0  PHQ - 2 Score 0 0 0    No Known Allergies  Prior to Admission medications   Not on File    Past Medical History:  Diagnosis Date  . Allergy     Past Surgical History:  Procedure Laterality Date  . CESAREAN SECTION     4    Social History   Tobacco Use  . Smoking status: Never Smoker  . Smokeless tobacco: Never Used  Substance Use Topics  . Alcohol use: No    Family History  Problem Relation Age of Onset  . Cancer Mother   . Diabetes Mother   . Depression Father   . Cancer Maternal Aunt   . Aneurysm Maternal Grandmother     ROS Per hpi  OBJECTIVE:  Blood pressure 126/80, pulse 81, temperature 98.8 F (37.1 C), temperature source Oral, height 5\' 2"  (1.575 m), weight 142 lb 9.6 oz (64.7 kg), SpO2 98 %. LMP 5/181/9  Physical Exam  Constitutional: She is oriented to person, place, and time. She appears well-developed and well-nourished.  HENT:  Head: Normocephalic  and atraumatic.  Mouth/Throat: Oropharynx is clear and moist and mucous membranes are normal. No oropharyngeal exudate.  Eyes: Pupils are equal, round, and reactive to light. Conjunctivae and EOM are normal. No scleral icterus.  Neck: Neck supple.  Cardiovascular: Normal rate, regular rhythm and normal heart sounds. Exam reveals no gallop and no friction rub.  No murmur heard. Pulmonary/Chest: Effort normal and breath sounds normal. She has no wheezes. She has no rales.  Abdominal: Soft. Bowel sounds are normal. She exhibits no distension. There is no hepatosplenomegaly. There is generalized tenderness. There is no rebound and no guarding.  Musculoskeletal: She exhibits no edema.  Neurological: She is alert and oriented to person, place, and time.  Skin: Skin is warm and dry.  Psychiatric: She has a normal mood and affect.  Nursing note and vitals reviewed.     Results for orders placed or performed in visit on 09/20/17 (from the past 24 hour(s))  POCT urine pregnancy     Status: Normal   Collection Time: 09/20/17 11:33 AM  Result Value Ref Range   Preg Test, Ur Negative Negative    ASSESSMENT and PLAN  1. Gastroenteritis, acute Discussed BRAT diet, push fluids, RTC precautions given. Excused from work until diarrhea resolves.  2. Encounter for contraceptive management, unspecified type Depo restarted  today, next dose due aug 13-27th, nurse visit ok - POCT urine pregnancy - medroxyPROGESTERone Acetate SUSY 150 mg  Return if symptoms worsen or fail to improve.    Rutherford Guys, MD Primary Care at [facility], [place] [address] Ph.  [phone] Fax [phone]

## 2019-05-29 ENCOUNTER — Ambulatory Visit: Payer: 59 | Attending: Internal Medicine

## 2019-05-29 ENCOUNTER — Other Ambulatory Visit: Payer: Self-pay

## 2019-05-29 ENCOUNTER — Ambulatory Visit (INDEPENDENT_AMBULATORY_CARE_PROVIDER_SITE_OTHER)
Admission: RE | Admit: 2019-05-29 | Discharge: 2019-05-29 | Disposition: A | Discharge status: Home / Self Care | Payer: Self-pay | Source: Ambulatory Visit

## 2019-05-29 DIAGNOSIS — Z20822 Contact with and (suspected) exposure to covid-19: Secondary | ICD-10-CM

## 2019-05-29 DIAGNOSIS — R197 Diarrhea, unspecified: Secondary | ICD-10-CM

## 2019-05-29 MED ORDER — CETIRIZINE HCL 10 MG PO TABS: 10.0000 mg | ORAL_TABLET | Freq: Every day | ORAL | 0 refills | Status: DC

## 2019-05-29 MED ORDER — FLUTICASONE PROPIONATE 50 MCG/ACT NA SUSP: 2.0000 | Freq: Every day | NASAL | 0 refills | Status: DC

## 2019-05-29 NOTE — ED Provider Notes (Signed)
[place]    Virtual Visit via Video Note:  Pam Brown  initiated request for Telemedicine visit with [facility] team. I connected with Pam Brown  on 05/29/2019 at 10:49 AM  for a synchronized telemedicine visit using a video enabled HIPPA compliant telemedicine application. I verified that I am speaking with Pam Brown  using two identifiers. Pam Box, PA-C  was physically located in a [facility] Urgent care site and Pam Brown was located at a different location.   The limitations of evaluation and management by telemedicine as well as the availability of in-person appointments were discussed. Patient was informed that she  may incur a bill ( including co-pay) for this virtual visit encounter. Pam Brown  expressed understanding and gave verbal consent to proceed with virtual visit.   [id number] 05/29/19 Arrival Time: [id number]  CC:  Flu-like symptoms  SUBJECTIVE:  Pam Brown is a 44 y.o. female who presents with complaint of fever, tmax of 99, and 8-9 episodes of watery diarrhea and loose stools (improving) x 1 day.  Son with similar symptoms.  Denies COVID exposure.  Denies recent antibiotic use or travel.  Denies abdominal pain. Has tried OTC tylenol, water and cranberry juice without relief.  Worse with eating.  Reports similar symptoms in the past with a GI bug.  Complains of associated runny nose.  Denies chills, congestion, sore throat, cough, nausea, vomiting, chest pain, SOB, abdominal pain, constipation, hematochezia, melena, dysuria, difficulty urinating, increased frequency or urgency, flank pain, loss of bowel or bladder function.  No LMP recorded.  ROS: As per HPI.  All other pertinent ROS negative.     Past Medical History:  Diagnosis Date  . Allergy    Past Surgical History:  Procedure Laterality Date  . CESAREAN SECTION     4   No Known Allergies No current facility-administered medications on  file prior to encounter.   No current outpatient medications on file prior to encounter.     OBJECTIVE:  There were no vitals filed for this visit.  General appearance: alert; no distress Eyes: EOMI grossly HENT: normocephalic; atraumatic Neck: supple with FROM Lungs: normal respiratory effort; speaking in full sentences without difficulty Extremities: moves extremities without difficulty Skin: No obvious rashes Neurologic: No facial asymmetries Psychological: alert and cooperative; normal mood and affect   ASSESSMENT & PLAN:  1. Suspected COVID-19 virus infection   2. Diarrhea, unspecified type     Meds ordered this encounter  Medications  . cetirizine (ZYRTEC) 10 MG tablet    Sig: Take 1 tablet (10 mg total) by mouth daily.    Dispense:  20 tablet    Refill:  0    Order Specific Question:   Supervising Provider    Answer:   Raylene Everts [id number]  . fluticasone (FLONASE) 50 MCG/ACT nasal spray    Sig: Place 2 sprays into both nostrils daily.    Dispense:  16 g    Refill:  0    Order Specific Question:   Supervising Provider    Answer:   Raylene Everts [id number]    Patient will go in person tomorrow to [facility] to have a COVID test done.   If you are unable to go in person you may do the following: You may text "COVID" to [phone], call [phone], OR  log on to [email or web address] to make an appointment  In the meantime: You should remain isolated in  your home for 10 days from symptom onset AND greater than 72 hours after symptoms resolution (absence of fever without the use of fever-reducing medication and improvement in respiratory symptoms), whichever is longer Get plenty of rest and push fluids Zyrtec prescribed for nasal congestion, runny nose, and/or sore throat Flonase prescribed as needed for nasal congestion and runny nose Use medications daily for symptom relief Use OTC medications like ibuprofen or tylenol as needed fever or  pain Call or go to the ED if you have any new or worsening symptoms such as fever, cough, shortness of breath, chest tightness, chest pain, turning blue, changes in mental status, persistent diarrhea, vomiting, abdominal pain, etc...    I discussed the assessment and treatment plan with the patient. The patient was provided an opportunity to ask questions and all were answered. The patient agreed with the plan and demonstrated an understanding of the instructions.   The patient was advised to call back or seek an in-person evaluation if the symptoms worsen or if the condition fails to improve as anticipated.  I provided 13 minutes of non-face-to-face time during this encounter.  Pam Box, PA-C  05/29/2019 10:49 AM    Pam Box, PA-C 05/29/19 1050

## 2019-05-29 NOTE — Discharge Instructions (Addendum)
Patient will go in person tomorrow to [facility] to have a COVID test done.   If you are unable to go in person you may do the following: You may text "COVID" to [phone], call [phone], OR  log on to [email or web address] to make an appointment  In the meantime: You should remain isolated in your home for 10 days from symptom onset AND greater than 72 hours after symptoms resolution (absence of fever without the use of fever-reducing medication and improvement in respiratory symptoms), whichever is longer Get plenty of rest and push fluids Zyrtec prescribed for nasal congestion, runny nose, and/or sore throat Flonase prescribed as needed for nasal congestion and runny nose Use medications daily for symptom relief Use OTC medications like ibuprofen or tylenol as needed fever or pain Call or go to the ED if you have any new or worsening symptoms such as fever, cough, shortness of breath, chest tightness, chest pain, turning blue, changes in mental status, persistent diarrhea, vomiting, abdominal pain, etc..Marland Kitchen

## 2019-05-30 LAB — NOVEL CORONAVIRUS, NAA: SARS-CoV-2, NAA: NOT DETECTED

## 2019-05-31 ENCOUNTER — Telehealth: Payer: Self-pay | Admitting: Family Medicine

## 2019-05-31 ENCOUNTER — Encounter: Payer: Self-pay | Admitting: Family Medicine

## 2019-05-31 NOTE — Telephone Encounter (Signed)
Patient wants a return to work note for Monday the 8th please call when ready for pick up or put in my chart

## 2019-06-01 ENCOUNTER — Telehealth: Payer: Self-pay | Admitting: Family Medicine

## 2019-06-01 ENCOUNTER — Other Ambulatory Visit: Payer: Self-pay

## 2019-06-01 NOTE — Telephone Encounter (Signed)
I have spoken to the provider and pt needs to have another appointment to be evaluated and cleared to go back to work. I have attempted to call pt with no answer so I left a message to call back.   Plan to have pt do a virtual appointment to check on status and ensure she is feeling better prior to writing letter for pt to return to work.

## 2019-06-01 NOTE — Telephone Encounter (Signed)
Pt called in let provider know that she is feeling better and that she does not need a work note anymore, her employer said she did not need one as long as she had the negative result

## 2019-09-19 ENCOUNTER — Ambulatory Visit (INDEPENDENT_AMBULATORY_CARE_PROVIDER_SITE_OTHER)
Admission: RE | Admit: 2019-09-19 | Discharge: 2019-09-19 | Disposition: A | Discharge status: Home / Self Care | Payer: Self-pay | Source: Ambulatory Visit

## 2019-09-19 DIAGNOSIS — J069 Acute upper respiratory infection, unspecified: Secondary | ICD-10-CM

## 2019-09-19 MED ORDER — PREDNISONE 10 MG PO TABS: 20.0000 mg | ORAL_TABLET | Freq: Every day | ORAL | 0 refills | Status: DC

## 2019-09-19 MED ORDER — CETIRIZINE HCL 10 MG PO TABS: 10.0000 mg | ORAL_TABLET | Freq: Every day | ORAL | 0 refills | Status: DC

## 2019-09-19 MED ORDER — BENZONATATE 100 MG PO CAPS: 100.0000 mg | ORAL_CAPSULE | Freq: Three times a day (TID) | ORAL | 0 refills | Status: DC

## 2019-09-19 NOTE — Discharge Instructions (Addendum)
Follow-up with PCP or return to the urgent care for a face-to-face visit if symptoms does not resolve Need Covid test to be completed if symptoms does not resolve

## 2019-09-19 NOTE — ED Provider Notes (Signed)
Virtual Visit via Video Note:  Pam Brown  initiated request for Telemedicine visit with [facility] team. I connected with Pam Brown  on 09/19/2019 at 39 AM  for a synchronized telemedicine visit using a video enabled HIPPA compliant telemedicine application. I verified that I am speaking with Pam Brown  using two identifiers. Pam Brown, Pam Brown  was physically located in a [facility] Urgent care site and Pam Brown was located at a different location.   The limitations of evaluation and management by telemedicine as well as the availability of in-person appointments were discussed. Patient was informed that she  may incur a bill ( including co-pay) for this virtual visit encounter. Pam Brown  expressed understanding and gave verbal consent to proceed with virtual visit.     History of Present Illness:Pam Brown  is a 44 y.o. female presents via telehealth with a complaint cough, sore throat, runny nose, watery eyes,  diarrhea for the past few days.  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  Denies recent travel.  Denies aggravating or alleviating symptoms.  Denies previous COVID infection.   Denies fever, chills, fatigue, nasal congestion,  SOB, wheezing, chest pain, nausea, vomiting, changes in  bladder habits.    Past Medical History:  Diagnosis Date  . Allergy     No Known Allergies      Observations/Objective: VITALS: Per patient if applicable, see vitals. GENERAL: Alert, appears well and in no acute distress. HEENT: Atraumatic, conjunctiva clear, no obvious abnormalities on inspection of external nose and ears. NECK: Normal movements of the head and neck. CARDIOPULMONARY: No increased WOB. Speaking in clear sentences. I:E ratio WNL.  PSYCH: Pleasant and cooperative, well-groomed. Speech normal rate and rhythm. Affect is appropriate. Insight and judgement are  appropriate. Attention is focused, linear, and appropriate.  NEURO: Oriented as arrived to appointment on time with no prompting. Moves both UE equally.  .    Assessment and Plan:   ICD-10-CM   1. Viral URI with cough  J06.9     Follow Up Instructions: Follow-up with PCP or return to the urgent care for a face-to-face visit if symptoms does not resolve Need Covid test to be completed if symptoms does not resolve   I discussed the assessment and treatment plan with the patient. The patient was provided an opportunity to ask questions and all were answered. The patient agreed with the plan and demonstrated an understanding of the instructions.   The patient was advised to call back or seek an in-person evaluation if the symptoms worsen or if the condition fails to improve as anticipated.  I provided 15 minutes of non-face-to-face time during this encounter.    Pam Brown, Pam Brown  09/19/2019 10:48 AM         Pam Brown, Pam Brown 09/19/19 1048

## 2019-09-20 ENCOUNTER — Encounter: Payer: Self-pay | Admitting: Family Medicine

## 2020-05-19 ENCOUNTER — Ambulatory Visit: Payer: 59 | Admitting: Family Medicine

## 2020-05-19 ENCOUNTER — Other Ambulatory Visit: Payer: Self-pay

## 2020-05-19 ENCOUNTER — Telehealth (INDEPENDENT_AMBULATORY_CARE_PROVIDER_SITE_OTHER): Payer: 59 | Admitting: Family Medicine

## 2020-05-19 ENCOUNTER — Encounter: Payer: Self-pay | Admitting: Family Medicine

## 2020-05-19 VITALS — Ht 62.0 in

## 2020-05-19 DIAGNOSIS — R519 Headache, unspecified: Secondary | ICD-10-CM | POA: Diagnosis not present

## 2020-05-19 DIAGNOSIS — Z1152 Encounter for screening for COVID-19: Secondary | ICD-10-CM | POA: Diagnosis not present

## 2020-05-19 NOTE — Progress Notes (Signed)
Pt was here for COVID  Swab pt swabbed and processed in lab. Order was on telemed visit 4:40 pm 05/19/2020

## 2020-05-19 NOTE — Addendum Note (Signed)
Addended by: Meredeth Ide on: 05/19/2020 01:36 PM   Modules accepted: Orders
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
## 2020-05-19 NOTE — Patient Instructions (Addendum)
 Viral Illness, Adult Viruses are tiny germs that can get into a person's body and cause illness. There are many different types of viruses, and they cause many types of illness. Viral illnesses can range from mild to severe. They can affect various parts of the body. Short-term conditions that are caused by a virus include colds and the flu (influenza). Long-term conditions that are caused by a virus include herpes, shingles, and HIV (human immunodeficiency virus) infection. A few viruses have been linked to certain cancers. What are the causes? Many types of viruses can cause illness. Viruses invade cells in your body, multiply, and cause the infected cells to work abnormally or die. When these cells die, they release more of the virus. When this happens, you develop symptoms of the illness, and the virus continues to spread to other cells. If the virus takes over the function of the cell, it can cause the cell to divide and grow out of control. This happens when a virus causes cancer. Different viruses get into the body in different ways. You can get a virus by:  Swallowing food or water that has come in contact with the virus (is contaminated).  Breathing in droplets that have been coughed or sneezed into the air by an infected person.  Touching a surface that has been contaminated with the virus and then touching your eyes, nose, or mouth.  Being bitten by an insect or animal that carries the virus.  Having sexual contact with a person who is infected with the virus.  Being exposed to blood or fluids that contain the virus, either through an open cut or during a transfusion. If a virus enters your body, your body's defense system (immune system) will try to fight the virus. You may be at higher risk for a viral illness if your immune system is weak. What are the signs or symptoms? You may have these symptoms, depending on the type of virus and the location of the cells that it  invades:  Cold and flu viruses: ? Fever. ? Headache. ? Sore throat. ? Muscle aches. ? Stuffy nose (nasal congestion). ? Cough.  Digestive system (gastrointestinal) viruses: ? Fever. ? Pain in the abdomen. ? Nausea. ? Diarrhea.  Liver viruses (hepatitis): ? Loss of appetite. ? Tiredness. ? Skin or the white parts of your eyes turning yellow (jaundice).  Brain and spinal cord viruses: ? Fever. ? Headache. ? Stiff neck. ? Nausea and vomiting. ? Confusion or sleepiness.  Skin viruses: ? Warts. ? Itching. ? Rash.  Sexually transmitted viruses: ? Discharge. ? Swelling. ? Redness. ? Rash. How is this diagnosed? This condition may be diagnosed based on one or more of the following:  Symptoms.  Medical history.  Physical exam.  Blood test, sample of mucus from your lungs (sputum sample), stool sample, or a swab of body fluids or a skin sore (lesion). How is this treated? Viruses can be hard to treat because they live within cells. Antibiotic medicines do not treat viruses because these medicines do not get inside cells. Treatment for a viral illness may include:  Resting and drinking plenty of fluids.  Medicines to relieve symptoms. These can include over-the-counter medicine for pain and fever, medicines for cough or congestion, and medicines to relieve diarrhea.  Antiviral medicines. These medicines are available only for certain types of viruses. Some viral illnesses can be prevented with vaccinations. A common example is the flu shot. Follow these instructions at home: Medicines  Take over-the-counter   and prescription medicines only as told by your health care provider.  If you were prescribed an antiviral medicine, take it as told by your health care provider. Do not stop taking the antiviral even if you start to feel better.  Be aware of when antibiotics are needed and when they are not needed. Antibiotics do not treat viruses. You may get an antibiotic if  your health care provider thinks that you may have, or are at risk for, a bacterial infection and you have a viral infection. ? Do not ask for an antibiotic prescription if you have been diagnosed with a viral illness. Antibiotics will not make your illness go away faster. ? Frequently taking antibiotics when they are not needed can lead to antibiotic resistance. When this develops, the medicine no longer works against the bacteria that it normally fights. General instructions  Drink enough fluids to keep your urine pale yellow.  Rest as much as possible.  Return to your normal activities as told by your health care provider. Ask your health care provider what activities are safe for you.  Keep all follow-up visits as told by your health care provider. This is important.   How is this prevented? To reduce your risk of viral illness:  Wash your hands often with soap and water for at least 20 seconds. If soap and water are not available, use hand sanitizer.  Avoid touching your nose, eyes, and mouth, especially if you have not washed your hands recently.  If anyone in your household has a viral infection, clean all household surfaces that may have been in contact with the virus. Use soap and hot water. You may also use bleach that you have added water to (diluted).  Stay away from people who are sick with symptoms of a viral infection.  Do not share items such as toothbrushes and water bottles with other people.  Keep your vaccinations up to date. This includes getting a yearly flu shot.  Eat a healthy diet and get plenty of rest.   Contact a health care provider if:  You have symptoms of a viral illness that do not go away.  Your symptoms come back after going away.  Your symptoms get worse. Get help right away if you have:  Trouble breathing.  A severe headache or a stiff neck.  Severe vomiting or pain in your abdomen. These symptoms may represent a serious problem that is  an emergency. Do not wait to see if the symptoms will go away. Get medical help right away. Call your local emergency services (911 in the [place] Do not drive yourself to the hospital. Summary  Viruses are types of germs that can get into a person's body and cause illness. Viral illnesses can range from mild to severe. They can affect various parts of the body.  Viruses can be hard to treat. There are medicines to relieve symptoms, and there are some antiviral medicines.  If you were prescribed an antiviral medicine, take it as told by your health care provider. Do not stop taking the antiviral even if you start to feel better.  Contact a health care provider if you have symptoms of a viral illness that do not go away. This information is not intended to replace advice given to you by your health care provider. Make sure you discuss any questions you have with your health care provider. Document Revised: 08/27/2019 Document Reviewed: 02/20/2019 Elsevier Patient Education  2021 Elsevier Inc.    If you   have lab work done today you will be contacted with your lab results within the next 2 weeks.  If you have not heard from us then please contact us. The fastest way to get your results is to register for My Chart.   IF you received an x-ray today, you will receive an invoice from [facility]. Please contact  Radiology at [phone] with questions or concerns regarding your invoice.   IF you received labwork today, you will receive an invoice from [facility]. Please contact [facility] at [phone] with questions or concerns regarding your invoice.   Our billing staff will not be able to assist you with questions regarding bills from these companies.  You will be contacted with the lab results as soon as they are available. The fastest way to get your results is to activate your My Chart account. Instructions are located on the last page of this paperwork. If you have not  heard from us regarding the results in 2 weeks, please contact this office.      

## 2020-05-21 LAB — COVID-19, FLU A+B AND RSV
Influenza A, NAA: NOT DETECTED
Influenza B, NAA: NOT DETECTED
RSV, NAA: NOT DETECTED
SARS-CoV-2, NAA: NOT DETECTED

## 2020-05-23 ENCOUNTER — Telehealth: Payer: Self-pay | Admitting: Family Medicine

## 2020-05-23 NOTE — Telephone Encounter (Signed)
returned pts call regarding covid testing / lvm to c/b

## 2020-05-28 ENCOUNTER — Other Ambulatory Visit: Payer: Self-pay

## 2020-05-28 ENCOUNTER — Telehealth (INDEPENDENT_AMBULATORY_CARE_PROVIDER_SITE_OTHER): Payer: 59 | Admitting: Family Medicine

## 2020-05-28 ENCOUNTER — Encounter: Payer: Self-pay | Admitting: Family Medicine

## 2020-05-28 DIAGNOSIS — R197 Diarrhea, unspecified: Secondary | ICD-10-CM | POA: Diagnosis not present

## 2020-05-28 NOTE — Progress Notes (Signed)
Virtual Visit Note  I connected with patient on 05/28/20 at 0912 by telephone due to unable to work Epic video visit and verified that I am speaking with the correct person using two identifiers. Pam Brown is currently located at home and no family members are currently with them during visit. The provider, Laurita Quint Maryah Marinaro, FNP is located in their office at time of visit.  I discussed the limitations, risks, security and privacy concerns of performing an evaluation and management service by telephone and the availability of in person appointments. I also discussed with the patient that there may be a patient responsible charge related to this service. The patient expressed understanding and agreed to proceed.   I provided 20 minutes of non-face-to-face time during this encounter.  Chief Complaint  Patient presents with  . 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 Children at home also started symptoms. Weakness due to symptoms. Last covid test 1/24 was negative   . Nasal Congestion    Runny nose- Taking tylenol     HPI ? Seen 1/24 via telemed: covid negative Continues to have issues with diarrhea Everything she eats seems to come out immediately 3 sons now also have diarrhea Continues to have issues with congestion Bottom is now sore Continues to have issues with diarrhea Still feeling weak and like she cant go to work Only taking tylenol at this time for symptoms   No Known Allergies  Prior to Admission medications   Medication Sig Start Date End Date Taking? Authorizing Provider  cetirizine (ZYRTEC ALLERGY) 10 MG tablet Take 1 tablet (10 mg total) by mouth daily. Patient not taking: No sig reported 09/19/19   Avegno, Darrelyn Hillock, FNP  fluticasone (FLONASE) 50 MCG/ACT nasal spray Place 2 sprays into both nostrils daily. Patient not taking: No sig reported 05/29/19   Lestine Box, PA-C    Past Medical History:  Diagnosis Date  .  Allergy     Past Surgical History:  Procedure Laterality Date  . CESAREAN SECTION     4    Social History   Tobacco Use  . Smoking status: Never Smoker  . Smokeless tobacco: Never Used  Substance Use Topics  . Alcohol use: No    Family History  Problem Relation Age of Onset  . Cancer Mother   . Diabetes Mother   . Depression Father   . Cancer Maternal Aunt   . Aneurysm Maternal Grandmother     Review of Systems  Constitutional: Positive for malaise/fatigue. Negative for chills and fever.  HENT: Positive for congestion. Negative for sinus pain and sore throat.   Respiratory: Negative for cough, sputum production, shortness of breath and wheezing.   Cardiovascular: Negative for chest pain and palpitations.  Gastrointestinal: Positive for abdominal pain and diarrhea. Negative for blood in stool, heartburn, nausea and vomiting.  Genitourinary: Negative for dysuria, frequency and urgency.  Musculoskeletal: Negative for back pain and myalgias.  Neurological: Negative for dizziness.    Objective  Constitutional:      General: Not in acute distress.    Appearance: Normal appearance. Not ill-appearing.   Pulmonary:     Effort: Pulmonary effort is normal. No respiratory distress.  Neurological:     Mental Status: Alert and oriented to person, place, and time.  Psychiatric:        Mood and Affect: Mood normal.        Behavior: Behavior normal.  ASSESSMENT and PLAN  Problem List Items Addressed This Visit   None   Visit Diagnoses    Diarrhea, unspecified type    -  Primary      Plan . Continue to increase fluids and educated on [employer] . Encouraged to start imodium as needed . Discussed OTC treatments due to rectum sore . RTC/ED precautions provided . Work note sent  Return in about 3 months (around 08/25/2020).    The above assessment and management plan was discussed with the patient. The patient verbalized understanding of and has agreed to the  management plan. Patient is aware to call the clinic if symptoms persist or worsen. Patient is aware when to return to the clinic for a follow-up visit. Patient educated on when it is appropriate to go to the emergency department.     Huston Foley Korion Cuevas, FNP-BC Primary Care at [place], [place] [address] Ph.  [phone] Fax [phone]

## 2020-05-28 NOTE — Patient Instructions (Addendum)
Diarrhea, Adult Diarrhea is when you pass loose and watery poop (stool) often. Diarrhea can make you feel weak and cause you to lose water in your body (get dehydrated). Losing water in your body can cause you to:  Feel tired and thirsty.  Have a dry mouth.  Go pee (urinate) less often. Diarrhea often lasts 2-3 days. However, it can last longer if it is a sign of something more serious. It is important to treat your diarrhea as told by your doctor. Follow these instructions at home: Eating and drinking Follow these instructions as told by your doctor:  Take an ORS (oral rehydration solution). This is a drink that helps you replace fluids and minerals your body lost. It is sold at pharmacies and stores.  Drink plenty of fluids, such as: ? Water. ? Ice chips. ? Diluted fruit juice. ? Low-calorie sports drinks. ? Milk, if you want.  Avoid drinking fluids that have a lot of sugar or caffeine in them.  Eat bland, easy-to-digest foods in small amounts as you are able. These foods include: ? Bananas. ? Applesauce. ? Rice. ? Low-fat (lean) meats. ? Toast. ? Crackers.  Avoid alcohol.  Avoid spicy or fatty foods.      Medicines  Take over-the-counter and prescription medicines only as told by your doctor.  If you were prescribed an antibiotic medicine, take it as told by your doctor. Do not stop using the antibiotic even if you start to feel better. General instructions  Wash your hands often using soap and water. If soap and water are not available, use a hand sanitizer. Others in your home should wash their hands as well. Hands should be washed: ? After using the toilet or changing a diaper. ? Before preparing, cooking, or serving food. ? While caring for a sick person. ? While visiting someone in a hospital.  Drink enough fluid to keep your pee (urine) pale yellow.  Rest at home while you get better.  Watch your condition for any changes.  Take a warm bath to help  with any burning or pain from having diarrhea.  Keep all follow-up visits as told by your doctor. This is important.   Contact a doctor if:  You have a fever.  Your diarrhea gets worse.  You have new symptoms.  You cannot keep fluids down.  You feel light-headed or dizzy.  You have a headache.  You have muscle cramps. Get help right away if:  You have chest pain.  You feel very weak or you pass out (faint).  You have bloody or black poop or poop that looks like tar.  You have very bad pain, cramping, or bloating in your belly (abdomen).  You have trouble breathing or you are breathing very quickly.  Your heart is beating very quickly.  Your skin feels cold and clammy.  You feel confused.  You have signs of losing too much water in your body, such as: ? Dark pee, very little pee, or no pee. ? Cracked lips. ? Dry mouth. ? Sunken eyes. ? Sleepiness. ? Weakness. Summary  Diarrhea is when you pass loose and watery poop (stool) often.  Diarrhea can make you feel weak and cause you to lose water in your body (get dehydrated).  Take an ORS (oral rehydration solution). This is a drink that is sold at pharmacies and stores.  Eat bland, easy-to-digest foods in small amounts as you are able.  Contact a doctor if your condition gets worse. Get  help right away if you have signs that you have lost too much water in your body. This information is not intended to replace advice given to you by your health care provider. Make sure you discuss any questions you have with your health care provider. Document Revised: 09/16/2017 Document Reviewed: 09/16/2017 Elsevier Patient Education  2021 [employer].   If you have lab work done today you will be contacted with your lab results within the next 2 weeks.  If you have not heard from [place] then please contact us. The fastest way to get your results is to register for My Chart.   IF you received an x-ray today, you will receive an  invoice from [facility]. Please contact [facility] at [phone] with questions or concerns regarding your invoice.   IF you received labwork today, you will receive an invoice from [facility]. Please contact [facility] at [phone] with questions or concerns regarding your invoice.   Our billing staff will not be able to assist you with questions regarding bills from these companies.  You will be contacted with the lab results as soon as they are available. The fastest way to get your results is to activate your My Chart account. Instructions are located on the last page of this paperwork. If you have not heard from [place] regarding the results in 2 weeks, please contact this office.

## 2020-06-18 ENCOUNTER — Ambulatory Visit: Payer: 59 | Admitting: Family Medicine

## 2020-07-24 ENCOUNTER — Ambulatory Visit: Payer: 59 | Admitting: Family Medicine

## 2020-08-18 ENCOUNTER — Ambulatory Visit: Payer: 59 | Admitting: Internal Medicine

## 2020-08-20 ENCOUNTER — Ambulatory Visit (INDEPENDENT_AMBULATORY_CARE_PROVIDER_SITE_OTHER): Payer: 59 | Admitting: Internal Medicine

## 2020-08-20 ENCOUNTER — Encounter: Payer: Self-pay | Admitting: Internal Medicine

## 2020-08-20 ENCOUNTER — Other Ambulatory Visit: Payer: Self-pay

## 2020-08-20 VITALS — BP 127/75 | HR 86 | Resp 16 | Ht <= 58 in | Wt 117.8 lb

## 2020-08-20 DIAGNOSIS — M25552 Pain in left hip: Secondary | ICD-10-CM | POA: Diagnosis not present

## 2020-08-20 DIAGNOSIS — Z3202 Encounter for pregnancy test, result negative: Secondary | ICD-10-CM

## 2020-08-20 DIAGNOSIS — Z30013 Encounter for initial prescription of injectable contraceptive: Secondary | ICD-10-CM | POA: Diagnosis not present

## 2020-08-20 DIAGNOSIS — Z7689 Persons encountering health services in other specified circumstances: Secondary | ICD-10-CM | POA: Diagnosis not present

## 2020-08-20 LAB — POCT URINE PREGNANCY: Preg Test, Ur: NEGATIVE

## 2020-08-20 MED ORDER — MEDROXYPROGESTERONE ACETATE 150 MG/ML IM SUSP
150.0000 mg | Freq: Once | INTRAMUSCULAR | Status: AC
  Administered 2020-08-20: 150 mg via INTRAMUSCULAR

## 2020-08-20 MED ORDER — DICLOFENAC SODIUM 1 % EX GEL: 2.0000 g | Freq: Four times a day (QID) | CUTANEOUS | 1 refills | Status: AC

## 2020-08-20 NOTE — Progress Notes (Signed)
           Subjective:    Pam Brown - 45 y.o. female MRN [id number]  Date of birth: 15-Aug-1975  HPI  Pam Brown is to establish care. She was last seen at [facility] prior to [place] Patient has a PMH significant for none. Surgical history of CS x4. Does endorse some left sided hip and leg pain. Prior doctor thought might be related to mild OA. Doesn't impact day to day activities but she has recently taken a second nighttime job cleaning and asks for Voltaren gel for pain relief. She has not had imaging of the area.   She would also like to start DepoProvera today.     ROS per HPI     Health Maintenance:  Health Maintenance Due  Topic Date Due  . Hepatitis C Screening  Never done  . COVID-19 Vaccine (1) Never done  . PAP SMEAR-Modifier  02/21/2019  . COLONOSCOPY (Pts 45-66yrs Insurance coverage will need to be confirmed)  Never done     Past Medical History: There are no problems to display for this patient.     Social History   reports that she has never smoked. She has never used smokeless tobacco. She reports that she does not drink alcohol and does not use drugs.   Family History  family history includes Aneurysm in her maternal grandmother; Cancer in her maternal aunt and mother; Depression in her father; Diabetes in her mother.   Medications: reviewed and updated   Objective:   Physical Exam BP 127/75   Pulse 86   Resp 16   Ht 4\' 9"  (1.448 m)   Wt 117 lb 12.8 oz (53.4 kg)   SpO2 98%   BMI 25.49 kg/m  Physical Exam Constitutional:      General: She is not in acute distress.    Appearance: She is not diaphoretic.  Cardiovascular:     Rate and Rhythm: Normal rate.  Pulmonary:     Effort: Pulmonary effort is normal. No respiratory distress.  Musculoskeletal:        General: Normal range of motion.  Skin:    General: Skin is warm and dry.  Neurological:     Mental Status: She is alert and oriented to person, place, and time.  Psychiatric:         Mood and Affect: Affect normal.        Judgment: Judgment normal.         Assessment & Plan:   1. Encounter to establish care Reviewed patient's PMH, social history, surgical history, and medications.  Is overdue for annual exam, screening blood work, and health maintenance topics. Have asked patient to return for visit to address these items.   2. Hip pain, left Will try topical pain relief. If unimproved, will plan to obtain imaging to work up further.  - diclofenac Sodium (VOLTAREN) 1 % GEL; Apply 2 g topically 4 (four) times daily.  Dispense: 150 g; Refill: 1  3. Encounter for initial prescription of injectable contraceptive Upreg neg. Start Depo q3 months.  - POCT urine pregnancy - medroxyPROGESTERone (DEPO-PROVERA) injection 150 mg     Phill Myron, D.O. 08/20/2020, 9:06 AM Primary Care at [facility]

## 2020-09-11 ENCOUNTER — Encounter: Payer: 59 | Admitting: Internal Medicine

## 2020-10-27 NOTE — Progress Notes (Signed)
Patient ID: Pam Brown, female    DOB: 21-Dec-1975  MRN: [id number]  CC: Annual Physical Exam  Subjective: Pam Brown is a 45 y.o. female who presents for annual physical exam.  Her concerns today include: Would like Depo injection as it is due. LMP: 10/25/2020.  There are no problems to display for this patient.    Current Outpatient Medications on File Prior to Visit  Medication Sig Dispense Refill   diclofenac Sodium (VOLTAREN) 1 % GEL Apply 2 g topically 4 (four) times daily. 150 g 1   No current facility-administered medications on file prior to visit.    Allergies  Allergen Reactions   Other     Social History   Socioeconomic History   Marital status: Single    Spouse name: Not on file   Number of children: 4   Years of education: 42   Highest education level: Not on file  Occupational History   Occupation: Systems developer    Comment: [employer]  Tobacco Use   Smoking status: Never   Smokeless tobacco: Never  Substance and Sexual Activity   Alcohol use: No   Drug use: No   Sexual activity: Yes  Other Topics Concern   Not on file  Social History Narrative   Some sit ups sometimes while at work   Social Determinants of Radio broadcast assistant Strain: Not on file  Food Insecurity: Not on file  Transportation Needs: Not on file  Physical Activity: Not on file  Stress: Not on file  Social Connections: Not on file  Intimate Partner Violence: Not on file    Family History  Problem Relation Age of Onset   Cancer Mother    Diabetes Mother    Depression Father    Cancer Maternal Aunt    Aneurysm Maternal Grandmother     Past Surgical History:  Procedure Laterality Date   CESAREAN SECTION     4    ROS: Review of Systems Negative except as stated above  PHYSICAL EXAM: BP 125/77 (BP Location: Left Arm, Patient Position: Sitting, Cuff Size: Normal)   Pulse 61   Temp 98.2 F (36.8 C)   Resp 16   Ht 4' 9.01" (1.448  m)   Wt 119 lb 6.4 oz (54.2 kg)   SpO2 98%   BMI 25.83 kg/m   Physical Exam Exam conducted with a chaperone present.  HENT:     Head: Normocephalic and atraumatic.     Right Ear: Tympanic membrane, ear canal and external ear normal.     Left Ear: Tympanic membrane, ear canal and external ear normal.  Eyes:     Extraocular Movements: Extraocular movements intact.     Conjunctiva/sclera: Conjunctivae normal.     Pupils: Pupils are equal, round, and reactive to light.  Cardiovascular:     Rate and Rhythm: Normal rate and regular rhythm.     Pulses: Normal pulses.     Heart sounds: Normal heart sounds.  Pulmonary:     Effort: Pulmonary effort is normal.     Breath sounds: Normal breath sounds.  Chest:  Breasts:    Right: Normal.     Left: Normal.     Comments: Elmon Else, CMA present during exam. Abdominal:     General: Bowel sounds are normal.     Palpations: Abdomen is soft.  Genitourinary:    General: Normal vulva.     Vagina: Normal.     Cervix: Normal.  Uterus: Normal.      Adnexa: Right adnexa normal and left adnexa normal.     Comments: Elmon Else, CMA present during exam.  Musculoskeletal:        General: Normal range of motion.     Cervical back: Normal range of motion and neck supple.  Skin:    General: Skin is warm and dry.     Capillary Refill: Capillary refill takes less than 2 seconds.  Neurological:     General: No focal deficit present.     Mental Status: She is alert and oriented to person, place, and time.  Psychiatric:        Mood and Affect: Mood normal.        Behavior: Behavior normal.    ASSESSMENT AND PLAN: 1. Annual physical exam: - Counseled on 150 minutes of exercise per week as tolerated, healthy eating (including decreased daily intake of saturated fats, cholesterol, added sugars, sodium), STI prevention, and routine healthcare maintenance.  2. Screening for metabolic disorder: - TYO06+YOKH to check kidney function, liver  function, and electrolyte balance.  - CMP14+EGFR  3. Screening for deficiency anemia: - CBC to screen for anemia. - CBC  4. Diabetes mellitus screening: - Hemoglobin A1c to screen for pre-diabetes/diabetes. - Hemoglobin A1c  5. Screening cholesterol level: - Lipid panel to screen for high cholesterol.  - Lipid Panel  6. Thyroid disorder screen: - TSH to check thyroid function.  - TSH  7. Need for hepatitis C screening test: - Hepatitis C antibody to screen for hepatitis C.  - Hepatitis C Antibody  8. Encounter for screening mammogram for malignant neoplasm of breast: - Referral for breast cancer screening by mammogram.  - MM Digital Screening; Future  9. Pap smear for cervical cancer screening: - Cytology - PAP for cervical cancer screening.  - Patient declined cervicovaginal ancillary screening.  - Cytology - PAP([place])  10. Colon cancer screening: - Referral to Gastroenterology for colon cancer screening by colonoscopy. - Ambulatory referral to Gastroenterology  11. Depo-Provera contraceptive status: - Administered today in office.  - medroxyPROGESTERone (DEPO-PROVERA) injection 150 mg   Patient was given the opportunity to ask questions.  Patient verbalized understanding of the plan and was able to repeat key elements of the plan. Patient was given clear instructions to go to Emergency Department or return to medical center if symptoms don't improve, worsen, or new problems develop.The patient verbalized understanding.   Orders Placed This Encounter  Procedures   MM Digital Screening   Hepatitis C Antibody   CBC   TSH   Lipid Panel   CMP14+EGFR   Hemoglobin A1c   Ambulatory referral to Gastroenterology   Follow-up with primary provider as scheduled.   [name], NP

## 2020-10-28 ENCOUNTER — Other Ambulatory Visit (HOSPITAL_COMMUNITY)
Admission: RE | Admit: 2020-10-28 | Discharge: 2020-10-28 | Disposition: A | Discharge status: Home / Self Care | Payer: 59 | Source: Ambulatory Visit | Attending: Family | Admitting: Family

## 2020-10-28 ENCOUNTER — Other Ambulatory Visit: Payer: Self-pay

## 2020-10-28 ENCOUNTER — Encounter: Payer: Self-pay | Admitting: Family

## 2020-10-28 ENCOUNTER — Ambulatory Visit (INDEPENDENT_AMBULATORY_CARE_PROVIDER_SITE_OTHER): Payer: 59 | Admitting: Family

## 2020-10-28 ENCOUNTER — Telehealth: Payer: Self-pay | Admitting: Family

## 2020-10-28 VITALS — BP 125/77 | HR 61 | Temp 98.2°F | Resp 16 | Ht <= 58 in | Wt 119.4 lb

## 2020-10-28 DIAGNOSIS — Z124 Encounter for screening for malignant neoplasm of cervix: Secondary | ICD-10-CM | POA: Diagnosis present

## 2020-10-28 DIAGNOSIS — Z131 Encounter for screening for diabetes mellitus: Secondary | ICD-10-CM

## 2020-10-28 DIAGNOSIS — Z1211 Encounter for screening for malignant neoplasm of colon: Secondary | ICD-10-CM

## 2020-10-28 DIAGNOSIS — Z1159 Encounter for screening for other viral diseases: Secondary | ICD-10-CM

## 2020-10-28 DIAGNOSIS — Z0000 Encounter for general adult medical examination without abnormal findings: Secondary | ICD-10-CM | POA: Diagnosis not present

## 2020-10-28 DIAGNOSIS — Z1329 Encounter for screening for other suspected endocrine disorder: Secondary | ICD-10-CM

## 2020-10-28 DIAGNOSIS — Z13220 Encounter for screening for lipoid disorders: Secondary | ICD-10-CM

## 2020-10-28 DIAGNOSIS — Z130 Encounter for screening for diseases of the blood and blood-forming organs and certain disorders involving the immune mechanism: Secondary | ICD-10-CM

## 2020-10-28 DIAGNOSIS — Z3042 Encounter for surveillance of injectable contraceptive: Secondary | ICD-10-CM

## 2020-10-28 DIAGNOSIS — Z13228 Encounter for screening for other metabolic disorders: Secondary | ICD-10-CM

## 2020-10-28 DIAGNOSIS — Z1231 Encounter for screening mammogram for malignant neoplasm of breast: Secondary | ICD-10-CM

## 2020-10-28 MED ORDER — MEDROXYPROGESTERONE ACETATE 150 MG/ML IM SUSP
150.0000 mg | Freq: Once | INTRAMUSCULAR | Status: AC
  Administered 2020-10-28: 150 mg via INTRAMUSCULAR

## 2020-10-28 NOTE — Telephone Encounter (Signed)
Patient asking if Durene Fruits NP will fill out a form/write a letter stating patient needs her dog that is a pitbull as a service dog so she can keep her dog with her. Please call patient and advise.

## 2020-10-28 NOTE — Patient Instructions (Signed)
Preventive Care 68-45 Years Old, Female Preventive care refers to lifestyle choices and visits with your health care provider that can promote health and wellness. This includes: A yearly physical exam. This is also called an annual wellness visit. Regular dental and eye exams. Immunizations. Screening for certain conditions. Healthy lifestyle choices, such as: Eating a healthy diet. Getting regular exercise. Not using drugs or products that contain nicotine and tobacco. Limiting alcohol use. What can I expect for my preventive care visit? Physical exam Your health care provider will check your: Height and weight. These may be used to calculate your BMI (body mass index). BMI is a measurement that tells if you are at a healthy weight. Heart rate and blood pressure. Body temperature. Skin for abnormal spots. Counseling Your health care provider may ask you questions about your: Past medical problems. Family's medical history. Alcohol, tobacco, and drug use. Emotional well-being. Home life and relationship well-being. Sexual activity. Diet, exercise, and sleep habits. Work and work Statistician. Access to firearms. Method of birth control. Menstrual cycle. Pregnancy history. What immunizations do I need?  Vaccines are usually given at various ages, according to a schedule. Your health care provider will recommend vaccines for you based on your age, medicalhistory, and lifestyle or other factors, such as travel or where you work. What tests do I need? Blood tests Lipid and cholesterol levels. These may be checked every 5 years, or more often if you are over 37 years old. Hepatitis C test. Hepatitis B test. Screening Lung cancer screening. You may have this screening every year starting at age 30 if you have a 30-pack-year history of smoking and currently smoke or have quit within the past 15 years. Colorectal cancer screening. All adults should have this screening starting at  age 23 and continuing until age 3. Your health care provider may recommend screening at age 88 if you are at increased risk. You will have tests every 1-10 years, depending on your results and the type of screening test. Diabetes screening. This is done by checking your blood sugar (glucose) after you have not eaten for a while (fasting). You may have this done every 1-3 years. Mammogram. This may be done every 1-2 years. Talk with your health care provider about when you should start having regular mammograms. This may depend on whether you have a family history of breast cancer. BRCA-related cancer screening. This may be done if you have a family history of breast, ovarian, tubal, or peritoneal cancers. Pelvic exam and Pap test. This may be done every 3 years starting at age 79. Starting at age 54, this may be done every 5 years if you have a Pap test in combination with an HPV test. Other tests STD (sexually transmitted disease) testing, if you are at risk. Bone density scan. This is done to screen for osteoporosis. You may have this scan if you are at high risk for osteoporosis. Talk with your health care provider about your test results, treatment options,and if necessary, the need for more tests. Follow these instructions at home: Eating and drinking  Eat a diet that includes fresh fruits and vegetables, whole grains, lean protein, and low-fat dairy products. Take vitamin and mineral supplements as recommended by your health care provider. Do not drink alcohol if: Your health care provider tells you not to drink. You are pregnant, may be pregnant, or are planning to become pregnant. If you drink alcohol: Limit how much you have to 0-1 drink a day. Be aware  of how much alcohol is in your drink. In the [place] one drink equals one 12 oz bottle of beer (355 mL), one 5 oz glass of wine (148 mL), or one 1 oz glass of hard liquor (44 mL).  Lifestyle Take daily care of your teeth and  gums. Brush your teeth every morning and night with fluoride toothpaste. Floss one time each day. Stay active. Exercise for at least 30 minutes 5 or more days each week. Do not use any products that contain nicotine or tobacco, such as cigarettes, e-cigarettes, and chewing tobacco. If you need help quitting, ask your health care provider. Do not use drugs. If you are sexually active, practice safe sex. Use a condom or other form of protection to prevent STIs (sexually transmitted infections). If you do not wish to become pregnant, use a form of birth control. If you plan to become pregnant, see your health care provider for a prepregnancy visit. If told by your health care provider, take low-dose aspirin daily starting at age 29. Find healthy ways to cope with stress, such as: Meditation, yoga, or listening to music. Journaling. Talking to a trusted person. Spending time with friends and family. Safety Always wear your seat belt while driving or riding in a vehicle. Do not drive: If you have been drinking alcohol. Do not ride with someone who has been drinking. When you are tired or distracted. While texting. Wear a helmet and other protective equipment during sports activities. If you have firearms in your house, make sure you follow all gun safety procedures. What's next? Visit your health care provider once a year for an annual wellness visit. Ask your health care provider how often you should have your eyes and teeth checked. Stay up to date on all vaccines. This information is not intended to replace advice given to you by your health care provider. Make sure you discuss any questions you have with your healthcare provider. Document Revised: 01/15/2020 Document Reviewed: 12/22/2017 Elsevier Patient Education  2022 [employer].

## 2020-10-28 NOTE — Progress Notes (Signed)
Pt presents for annual physical exam w/ pap declines STD screening. Depo-provera injection due

## 2020-10-29 ENCOUNTER — Telehealth: Payer: Self-pay | Admitting: Family

## 2020-10-29 LAB — TSH: TSH: 2.15 u[IU]/mL (ref 0.450–4.500)

## 2020-10-29 LAB — CBC
Hematocrit: 40.6 % (ref 34.0–46.6)
Hemoglobin: 13.1 g/dL (ref 11.1–15.9)
MCH: 25.5 pg — ABNORMAL LOW (ref 26.6–33.0)
MCHC: 32.3 g/dL (ref 31.5–35.7)
MCV: 79 fL (ref 79–97)
Platelets: 332 10*3/uL (ref 150–450)
RBC: 5.13 x10E6/uL (ref 3.77–5.28)
RDW: 19.3 % — ABNORMAL HIGH (ref 11.7–15.4)
WBC: 7.4 10*3/uL (ref 3.4–10.8)

## 2020-10-29 LAB — CMP14+EGFR
ALT: 9 IU/L (ref 0–32)
AST: 12 IU/L (ref 0–40)
Albumin/Globulin Ratio: 1.7 (ref 1.2–2.2)
Albumin: 4.3 g/dL (ref 3.8–4.8)
Alkaline Phosphatase: 76 IU/L (ref 44–121)
BUN/Creatinine Ratio: 17 (ref 9–23)
BUN: 11 mg/dL (ref 6–24)
Bilirubin Total: <0.2 mg/dL (ref 0.0–1.2)
CO2: 21 mmol/L (ref 20–29)
Calcium: 8.9 mg/dL (ref 8.7–10.2)
Chloride: 107 mmol/L — ABNORMAL HIGH (ref 96–106)
Creatinine, Ser: 0.65 mg/dL (ref 0.57–1.00)
Globulin, Total: 2.5 g/dL (ref 1.5–4.5)
Glucose: 56 mg/dL — ABNORMAL LOW (ref 65–99)
Potassium: 4.2 mmol/L (ref 3.5–5.2)
Sodium: 141 mmol/L (ref 134–144)
Total Protein: 6.8 g/dL (ref 6.0–8.5)
eGFR: 111 mL/min/{1.73_m2} (ref >59)

## 2020-10-29 LAB — LIPID PANEL
Chol/HDL Ratio: 4 ratio (ref 0.0–4.4)
Cholesterol, Total: 167 mg/dL (ref 100–199)
HDL: 42 mg/dL (ref >39)
LDL Chol Calc (NIH): 112 mg/dL — ABNORMAL HIGH (ref 0–99)
Triglycerides: 69 mg/dL (ref 0–149)
VLDL Cholesterol Cal: 13 mg/dL (ref 5–40)

## 2020-10-29 LAB — HEPATITIS C ANTIBODY: Hep C Virus Ab: <0.1 s/co ratio (ref 0.0–0.9)

## 2020-10-29 LAB — HEMOGLOBIN A1C
Est. average glucose Bld gHb Est-mCnc: 108 mg/dL
Hgb A1c MFr Bld: 5.4 % (ref 4.8–5.6)

## 2020-10-29 NOTE — Progress Notes (Signed)
Kidney function normal.   Liver function normal.   Thyroid function normal.   No diabetes.  No anemia. Reminder to increase iron-rich foods in the diet such as green leafy vegetables, raisins, or apricots.   Hepatitis C negative.  Cholesterol higher than expected. High cholesterol may increase risk of heart attack and/or stroke. Consider eating more fruits, vegetables, and lean baked meats such as chicken or fish. Moderate intensity exercise at least 150 minutes as tolerated per week may help as well. However your risk of heart attack/stroke in ten years is low so does not need to start a medication at the moment. Encouraged to recheck in 3 to 6 months.  The following is for provider reference only: The 10-year ASCVD risk score is: 1.4%   Values used to calculate the score:     Age: 45 years     Sex: Female     Is Non-Hispanic African American: Yes     Diabetic: No     Tobacco smoker: No     Systolic Blood Pressure: 170 mmHg     Is BP treated: No     HDL Cholesterol: 42 mg/dL     Total Cholesterol: 167 mg/dL

## 2020-10-30 LAB — CYTOLOGY - PAP
Comment: NEGATIVE
Diagnosis: NEGATIVE
High risk HPV: NEGATIVE

## 2020-10-30 NOTE — Telephone Encounter (Signed)
Called patient and lvm to have patient call the office back so I can inform her of the results of the message she left on October 28, 2020.

## 2020-10-30 NOTE — Progress Notes (Signed)
PAP smear negative for lesion and malignancy.   HPV negative.   Repeat PAP smear in 3 years.

## 2020-10-30 NOTE — Telephone Encounter (Signed)
Called patient and informed her. Pt is aware.

## 2020-11-05 ENCOUNTER — Ambulatory Visit: Payer: 59

## 2020-11-17 ENCOUNTER — Other Ambulatory Visit: Payer: Self-pay

## 2020-11-17 ENCOUNTER — Ambulatory Visit (HOSPITAL_COMMUNITY)
Admission: EM | Admit: 2020-11-17 | Discharge: 2020-11-17 | Disposition: A | Discharge status: Home / Self Care | Payer: 59 | Attending: Internal Medicine | Admitting: Internal Medicine

## 2020-11-17 ENCOUNTER — Encounter (HOSPITAL_COMMUNITY): Payer: Self-pay | Admitting: *Deleted

## 2020-11-17 DIAGNOSIS — R0789 Other chest pain: Secondary | ICD-10-CM

## 2020-11-17 LAB — COMPREHENSIVE METABOLIC PANEL
ALT: 11 U/L (ref 0–44)
AST: 15 U/L (ref 15–41)
Albumin: 3.7 g/dL (ref 3.5–5.0)
Alkaline Phosphatase: 55 U/L (ref 38–126)
Anion gap: 6 (ref 5–15)
BUN: 7 mg/dL (ref 6–20)
CO2: 25 mmol/L (ref 22–32)
Calcium: 9.2 mg/dL (ref 8.9–10.3)
Chloride: 106 mmol/L (ref 98–111)
Creatinine, Ser: 0.66 mg/dL (ref 0.44–1.00)
GFR, Estimated: >60 mL/min (ref >60)
Glucose, Bld: 80 mg/dL (ref 70–99)
Potassium: 4.6 mmol/L (ref 3.5–5.1)
Sodium: 137 mmol/L (ref 135–145)
Total Bilirubin: 0.2 mg/dL — ABNORMAL LOW (ref 0.3–1.2)
Total Protein: 6.9 g/dL (ref 6.5–8.1)

## 2020-11-17 LAB — CBC WITH DIFFERENTIAL/PLATELET
Abs Immature Granulocytes: 0.01 10*3/uL (ref 0.00–0.07)
Basophils Absolute: 0 10*3/uL (ref 0.0–0.1)
Basophils Relative: 0 %
Eosinophils Absolute: 0.1 10*3/uL (ref 0.0–0.5)
Eosinophils Relative: 2 %
HCT: 41.1 % (ref 36.0–46.0)
Hemoglobin: 13.3 g/dL (ref 12.0–15.0)
Immature Granulocytes: 0 %
Lymphocytes Relative: 33 %
Lymphs Abs: 2.3 10*3/uL (ref 0.7–4.0)
MCH: 25.6 pg — ABNORMAL LOW (ref 26.0–34.0)
MCHC: 32.4 g/dL (ref 30.0–36.0)
MCV: 79 fL — ABNORMAL LOW (ref 80.0–100.0)
Monocytes Absolute: 0.7 10*3/uL (ref 0.1–1.0)
Monocytes Relative: 10 %
Neutro Abs: 3.8 10*3/uL (ref 1.7–7.7)
Neutrophils Relative %: 55 %
Platelets: 338 10*3/uL (ref 150–400)
RBC: 5.2 MIL/uL — ABNORMAL HIGH (ref 3.87–5.11)
RDW: 19.1 % — ABNORMAL HIGH (ref 11.5–15.5)
WBC: 7 10*3/uL (ref 4.0–10.5)
nRBC: 0 % (ref 0.0–0.2)

## 2020-11-17 MED ORDER — FAMOTIDINE 20 MG PO TABS: 20.0000 mg | ORAL_TABLET | Freq: Two times a day (BID) | ORAL | 0 refills | Status: AC

## 2020-11-17 NOTE — Discharge Instructions (Signed)
Start famotidine to ensure that acid reflux is not contributing to symptoms.  Avoid spicy/acidic/fatty foods as we discussed.  If you have recurrence of your chest pain you need to go to the emergency room.  Please follow-up with your primary care doctor as we discussed.  Please contact cardiology to schedule follow-up appointment.  We will contact you if your lab work is abnormal.

## 2020-11-17 NOTE — ED Triage Notes (Signed)
Pt reports LT CP started one week ago

## 2020-11-17 NOTE — ED Provider Notes (Signed)
[facility]    CSN: [id number] Arrival date & time: 11/17/20  [id number]      History   Chief Complaint Chief Complaint  Patient presents with   Chest Pain    For one week    HPI Pam Brown is a 45 y.o. female.   Patient presents today with a 10-day history of intermittent chest pain.  Reports that she had 2 separate episodes both occurring on a Friday where she developed 3 to 5 minutes of chest tightness.  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  She reports prior to both episodes she had eaten fast food between her 2 jobs and wonders if diet could be contributing to symptoms.  She denies any history of cardiovascular disease but does have a family history of cardiovascular disease in her mother; unsure age of onset.  She has not tried any over-the-counter medication for symptom management.  She is currently asymptomatic.  Denies diaphoresis, nausea, vomiting during episode.  She does have a history of hyperlipidemia but denies additional risk factors including smoking, diabetes, hypertension.    Past Medical History:  Diagnosis Date   Allergy     There are no problems to display for this patient.   Past Surgical History:  Procedure Laterality Date   CESAREAN SECTION     4    OB History   No obstetric history on file.      Home Medications    Prior to Admission medications   Medication Sig Start Date End Date Taking? Authorizing Provider  famotidine (PEPCID) 20 MG tablet Take 1 tablet (20 mg total) by mouth 2 (two) times daily. 11/17/20  Yes Lance Galas K, PA-C  diclofenac Sodium (VOLTAREN) 1 % GEL Apply 2 g topically 4 (four) times daily. 08/20/20   Nicolette Bang, MD    Family History Family History  Problem Relation Age of Onset   Cancer Mother    Diabetes Mother    Depression Father    Cancer Maternal Aunt     Aneurysm Maternal Grandmother     Social History Social History   Tobacco Use   Smoking status: Never   Smokeless tobacco: Never  Substance Use Topics   Alcohol use: No   Drug use: No     Allergies   Other   Review of Systems Review of Systems  Constitutional:  Negative for activity change, appetite change, diaphoresis, fatigue and fever.  Respiratory:  Negative for cough and shortness of breath.   Cardiovascular:  Positive for chest pain. Negative for palpitations and leg swelling.  Gastrointestinal:  Negative for abdominal pain, diarrhea, nausea and vomiting.  Neurological:  Negative for dizziness, light-headedness and headaches.    Physical Exam Triage Vital Signs ED Triage Vitals  Enc Vitals Group     BP 11/17/20 1109 129/61     Pulse Rate 11/17/20 1109 (!) 58     Resp 11/17/20 1109 20     Temp --      Temp src --      SpO2 11/17/20 1109 100 %     Weight --      Height --      Head Circumference --      Peak Flow --      Pain Score 11/17/20 1111 0     Pain Loc --      Pain  Edu? --      Excl. in [place]? --    No data found.  Updated Vital Signs BP 129/61   Pulse (!) 58   Resp 20   SpO2 100%   Visual Acuity Right Eye Distance:   Left Eye Distance:   Bilateral Distance:    Right Eye Near:   Left Eye Near:    Bilateral Near:     Physical Exam Vitals reviewed.  Constitutional:      General: She is awake. She is not in acute distress.    Appearance: Normal appearance. She is normal weight. She is not ill-appearing.     Comments: Very pleasant female appears stated age sitting comfortably on exam room table in no acute distress  HENT:     Head: Normocephalic and atraumatic.  Cardiovascular:     Rate and Rhythm: Normal rate and regular rhythm.     Heart sounds: Normal heart sounds, S1 normal and S2 normal. No murmur heard. Pulmonary:     Effort: Pulmonary effort is normal.     Breath sounds: Normal breath sounds. No wheezing, rhonchi or rales.      Comments: Clear to auscultation bilaterally Chest:     Chest wall: No deformity, swelling or tenderness.  Abdominal:     Palpations: Abdomen is soft.     Tenderness: There is no abdominal tenderness.  Psychiatric:        Behavior: Behavior is cooperative.     UC Treatments / Results  Labs (all labs ordered are listed, but only abnormal results are displayed) Labs Reviewed  CBC WITH DIFFERENTIAL/PLATELET  COMPREHENSIVE METABOLIC PANEL    EKG   Radiology No results found.  Procedures Procedures (including critical care time)  Medications Ordered in UC Medications - No data to display  Initial Impression / Assessment and Plan / UC Course  I have reviewed the triage vital signs and the nursing notes.  Pertinent labs & imaging results that were available during my care of the patient were reviewed by me and considered in my medical decision making (see chart for details).      EKG obtained showed normal sinus rhythm with ventricular rate of 63 bpm and no ischemic changes; no previous to compare.  Patient is currently asymptomatic and doing well.  Basic labs obtained today CBC, CMP-results pending.  Given association with eating concern for GERD so we will start famotidine to see if this will help manage symptoms.  Patient was given contact information for cardiology office and encouraged to call to schedule an appointment given associated chest pain.  She was encouraged to follow-up with her primary care provider as we discussed.  If she has any recurrent acute symptoms she needs to go to the emergency room.  Strict return precautions given to which patient expressed understanding.  Final Clinical Impressions(s) / UC Diagnoses   Final diagnoses:  Atypical chest pain  Chest tightness     Discharge Instructions      Start famotidine to ensure that acid reflux is not contributing to symptoms.  Avoid spicy/acidic/fatty foods as we discussed.  If you have recurrence of your  chest pain you need to go to the emergency room.  Please follow-up with your primary care doctor as we discussed.  Please contact cardiology to schedule follow-up appointment.  We will contact you if your lab work is abnormal.     ED Prescriptions     Medication Sig Dispense Auth. Provider   famotidine (PEPCID) 20 MG tablet  Take 1 tablet (20 mg total) by mouth 2 (two) times daily. 30 tablet Darrie Macmillan, Derry Skill, PA-C      PDMP not reviewed this encounter.   Terrilee Croak, PA-C 11/17/20 1204

## 2020-12-19 ENCOUNTER — Telehealth: Payer: Self-pay | Admitting: Family

## 2020-12-19 NOTE — Telephone Encounter (Signed)
Pt called in about a stye on left eye and wanted some type of medication sent in for it she was last seen 10/28/20

## 2020-12-23 ENCOUNTER — Telehealth: Payer: Self-pay | Admitting: Family

## 2020-12-23 NOTE — Telephone Encounter (Signed)
Pt states she put something on it and its not as pink and will put OTC meds on it not looking to make an appt at the moment

## 2021-01-26 ENCOUNTER — Other Ambulatory Visit: Payer: Self-pay

## 2021-01-26 ENCOUNTER — Ambulatory Visit (INDEPENDENT_AMBULATORY_CARE_PROVIDER_SITE_OTHER): Payer: 59

## 2021-01-26 ENCOUNTER — Encounter: Payer: Self-pay | Admitting: Family

## 2021-01-26 DIAGNOSIS — Z3042 Encounter for surveillance of injectable contraceptive: Secondary | ICD-10-CM | POA: Diagnosis not present

## 2021-01-26 MED ORDER — MEDROXYPROGESTERONE ACETATE 150 MG/ML IM SUSP
150.0000 mg | Freq: Once | INTRAMUSCULAR | Status: AC
  Administered 2021-01-26: 150 mg via INTRAMUSCULAR

## 2021-01-26 NOTE — Progress Notes (Signed)
Pt presents for depo provera 13 week injection last given on 10/28/20 Administered in left deltoid, pt tolerated injection well

## 2021-02-04 ENCOUNTER — Ambulatory Visit
Admission: RE | Admit: 2021-02-04 | Discharge: 2021-02-04 | Disposition: A | Discharge status: Home / Self Care | Payer: 59 | Source: Ambulatory Visit | Attending: Family | Admitting: Family

## 2021-02-04 DIAGNOSIS — Z1231 Encounter for screening mammogram for malignant neoplasm of breast: Secondary | ICD-10-CM

## 2021-02-09 ENCOUNTER — Other Ambulatory Visit: Payer: Self-pay | Admitting: Family

## 2021-02-09 ENCOUNTER — Encounter: Payer: 59 | Admitting: Gastroenterology

## 2021-02-09 DIAGNOSIS — R928 Other abnormal and inconclusive findings on diagnostic imaging of breast: Secondary | ICD-10-CM

## 2021-02-10 ENCOUNTER — Telehealth: Payer: Self-pay | Admitting: Gastroenterology

## 2021-02-10 ENCOUNTER — Other Ambulatory Visit: Payer: Self-pay

## 2021-02-10 ENCOUNTER — Ambulatory Visit: Payer: 59 | Admitting: *Deleted

## 2021-02-10 VITALS — Ht 59.0 in | Wt 120.0 lb

## 2021-02-10 DIAGNOSIS — Z1211 Encounter for screening for malignant neoplasm of colon: Secondary | ICD-10-CM

## 2021-02-10 MED ORDER — GOLYTELY 236 G PO SOLR: 4000.0000 mL | Freq: Once | ORAL | 0 refills | Status: AC

## 2021-02-10 NOTE — Telephone Encounter (Signed)
Received the work note you sent her, but dates are incorrect.  Procedure is on the 7th and her note needs to be

## 2021-02-10 NOTE — Progress Notes (Unsigned)
Pt's previsit is done over the phone and all paperwork (prep instructions, blank consent form to just read over) sent to patient.  Pt's name and DOB verified at the beginning of the previsit.  Pt denies any difficulty with ambulating.    No trouble with anesthesia, denies trouble moving neck, or hx/fam hx of malignant hyperthermia per pt   No egg or soy allergy  No home oxygen use   No medications for weight loss taken  emmi information given  Pt denies constipation issues  Pt informed that we do not do prior authorizations for prep  Pt requests work note emailed she can let her employer know she is having a procedure on 03-02-21. Work note emailed to [email or web address]

## 2021-02-25 ENCOUNTER — Telehealth: Payer: Self-pay | Admitting: Family

## 2021-02-25 NOTE — Telephone Encounter (Signed)
Pt states she had depo shot but is still bleeding. Pt  states her work is quite strenuous at the moment (walking, and moping a huge trailer) and is asking if is possible for her to obtain a letter for work to have an adjustment in her duties for work. Pt is asking if she needs an appt for this or if she can know when she can pick this up. Please advise and thank you.

## 2021-02-26 NOTE — Telephone Encounter (Signed)
Please offer patient appointment with me.

## 2021-03-02 ENCOUNTER — Ambulatory Visit (AMBULATORY_SURGERY_CENTER): Payer: 59 | Admitting: Gastroenterology

## 2021-03-02 ENCOUNTER — Other Ambulatory Visit: Payer: Self-pay

## 2021-03-02 ENCOUNTER — Encounter: Payer: Self-pay | Admitting: Gastroenterology

## 2021-03-02 VITALS — BP 119/54 | HR 52 | Temp 98.2°F | Resp 19 | Ht 59.0 in | Wt 120.0 lb

## 2021-03-02 DIAGNOSIS — K635 Polyp of colon: Secondary | ICD-10-CM

## 2021-03-02 DIAGNOSIS — D124 Benign neoplasm of descending colon: Secondary | ICD-10-CM

## 2021-03-02 DIAGNOSIS — Z1211 Encounter for screening for malignant neoplasm of colon: Secondary | ICD-10-CM

## 2021-03-02 MED ORDER — SODIUM CHLORIDE 0.9 % IV SOLN: 500.0000 mL | Freq: Once | INTRAVENOUS | Status: DC

## 2021-03-02 NOTE — Op Note (Signed)
[facility] Patient Name: Pam Brown Procedure Date: 03/02/2021 11:04 AM MRN: [id number] Endoscopist: Thornton Park MD, MD Age: 45 Referring MD:  Date of Birth: 04/20/1976 Gender: Female Account #: [id number] Procedure:                Colonoscopy Indications:              Screening for colorectal malignant neoplasm, This                            is the patient's first colonoscopy                           No known family history of colon cancer or polyps Medicines:                Monitored Anesthesia Care Procedure:                Pre-Anesthesia Assessment:                           - Prior to the procedure, a History and Physical                            was performed, and patient medications and                            allergies were reviewed. The patient's tolerance of                            previous anesthesia was also reviewed. The risks                            and benefits of the procedure and the sedation                            options and risks were discussed with the patient.                            All questions were answered, and informed consent                            was obtained. Prior Anticoagulants: The patient has                            taken no previous anticoagulant or antiplatelet                            agents. ASA Grade Assessment: II - A patient with                            mild systemic disease. After reviewing the risks                            and benefits, the patient was deemed in  satisfactory condition to undergo the procedure.                           After obtaining informed consent, the colonoscope                            was passed under direct vision. Throughout the                            procedure, the patient's blood pressure, pulse, and                            oxygen saturations were monitored continuously. The                            Olympus PCF-H190DL  [id number]) Colonoscope was                            introduced through the anus and advanced to the the                            cecum, identified by appendiceal orifice and                            ileocecal valve. A second forward view of the right                            colon was performed. The colonoscopy was performed                            without difficulty. The patient tolerated the                            procedure well. The quality of the bowel                            preparation was good. The ileocecal valve,                            appendiceal orifice, and rectum were photographed. Scope In: 11:09:46 AM Scope Out: 11:22:22 AM Scope Withdrawal Time: 0 hours 9 minutes 32 seconds  Total Procedure Duration: 0 hours 12 minutes 36 seconds  Findings:                 The perianal and digital rectal examinations were                            normal.                           A 2 mm polyp was found in the descending colon. The                            polyp was flat. The polyp was removed with a cold  snare. Resection and retrieval were complete.                            Estimated blood loss was minimal.                           The exam was otherwise without abnormality on                            direct and retroflexion views. Complications:            No immediate complications. Estimated blood loss:                            Minimal. Estimated Blood Loss:     Estimated blood loss was minimal. Impression:               - One 2 mm polyp in the descending colon, removed                            with a cold snare. Resected and retrieved.                           - The examination was otherwise normal on direct                            and retroflexion views. Recommendation:           - Patient has a contact number available for                            emergencies. The signs and symptoms of potential                             delayed complications were discussed with the                            patient. Return to normal activities tomorrow.                            Written discharge instructions were provided to the                            patient.                           - Resume previous diet.                           - Continue present medications.                           - Await pathology results.                           - Repeat colonoscopy date to be determined after  pending pathology results are reviewed for                            surveillance.                           - Emerging evidence supports eating a diet of                            fruits, vegetables, grains, calcium, and yogurt                            while reducing red meat and alcohol may reduce the                            risk of colon cancer.                           - Thank you for allowing me to be involved in your                            colon cancer prevention. Thornton Park MD, MD 03/02/2021 11:25:18 AM This report has been signed electronically.

## 2021-03-02 NOTE — Progress Notes (Signed)
Report to PACU, RN, vss, BBS= Clear.  

## 2021-03-02 NOTE — Progress Notes (Signed)
Pt's states no medical or surgical changes since previsit or office visit.   CHECK-IN-AER  V/S-[place] 

## 2021-03-02 NOTE — Progress Notes (Signed)
   Referring Provider: [name], NP Primary Care Physician:  [name], NP  Reason for Procedure:  Colon cancer screening   IMPRESSION:  Need for colon cancer screening Appropriate candidate for monitored anesthesia care  PLAN: Colonoscopy in the [facility] today   HPI: Pam Brown is a 45 y.o. female presents for screening colonoscopy.  No prior colonoscopy or colon cancer screening.  No baseline GI symptoms.   No known family history of colon cancer or polyps. No family history of uterine/endometrial cancer, pancreatic cancer or gastric/stomach cancer.   Past Medical History:  Diagnosis Date   Allergy    Depression    after mother's death 14 years ago   GERD (gastroesophageal reflux disease)    Sickle cell trait (HCC)     Past Surgical History:  Procedure Laterality Date   CESAREAN SECTION     4    Current Outpatient Medications  Medication Sig Dispense Refill   famotidine (PEPCID) 20 MG tablet Take 1 tablet (20 mg total) by mouth 2 (two) times daily. 30 tablet 0   diclofenac Sodium (VOLTAREN) 1 % GEL Apply 2 g topically 4 (four) times daily. 150 g 1   Current Facility-Administered Medications  Medication Dose Route Frequency Provider Last Rate Last Admin   0.9 %  sodium chloride infusion  500 mL Intravenous Once Thornton Park, MD        Allergies as of 03/02/2021   (No Known Allergies)    Family History  Problem Relation Age of Onset   Cancer Mother    Diabetes Mother    Depression Father    Cancer Maternal Aunt    Aneurysm Maternal Grandmother    Colon cancer Neg Hx    Esophageal cancer Neg Hx    Stomach cancer Neg Hx    Rectal cancer Neg Hx      Physical Exam: General:   Alert,  well-nourished, pleasant and cooperative in NAD Head:  Normocephalic and atraumatic. Eyes:  Sclera clear, no icterus.   Conjunctiva pink. Mouth:  No deformity or lesions.   Neck:  Supple; no masses or thyromegaly. Lungs:  Clear throughout to  auscultation.   No wheezes. Heart:  Regular rate and rhythm; no murmurs. Abdomen:  Soft, non-tender, nondistended, normal bowel sounds, no rebound or guarding.  Msk:  Symmetrical. No boney deformities LAD: No inguinal or umbilical LAD Extremities:  No clubbing or edema. Neurologic:  Alert and  oriented x4;  grossly nonfocal Skin:  No obvious rash or bruise. Psych:  Alert and cooperative. Normal mood and affect.      Makeda Peeks L. Tarri Glenn, MD, MPH 03/02/2021, 11:03 AM

## 2021-03-02 NOTE — Progress Notes (Signed)
Called to room to assist during endoscopic procedure.  Patient ID and intended procedure confirmed with present staff. Received instructions for my participation in the procedure from the performing physician.  

## 2021-03-02 NOTE — Patient Instructions (Signed)
YOU HAD AN ENDOSCOPIC PROCEDURE TODAY AT THE [facility]:   Refer to the procedure report that was given to you for any specific questions about what was found during the examination.  If the procedure report does not answer your questions, please call your gastroenterologist to clarify.  If you requested that your care partner not be given the details of your procedure findings, then the procedure report has been included in a sealed envelope for you to review at your convenience later.  YOU SHOULD EXPECT: Some feelings of bloating in the abdomen. Passage of more gas than usual.  Walking can help get rid of the air that was put into your GI tract during the procedure and reduce the bloating. If you had a lower endoscopy (such as a colonoscopy or flexible sigmoidoscopy) you may notice spotting of blood in your stool or on the toilet paper. If you underwent a bowel prep for your procedure, you may not have a normal bowel movement for a few days.  Please Note:  You might notice some irritation and congestion in your nose or some drainage.  This is from the oxygen used during your procedure.  There is no need for concern and it should clear up in a day or so.  SYMPTOMS TO REPORT IMMEDIATELY:   Following lower endoscopy (colonoscopy or flexible sigmoidoscopy):  Excessive amounts of blood in the stool  Significant tenderness or worsening of abdominal pains  Swelling of the abdomen that is new, acute  Fever of 100F or higher   Following upper endoscopy (EGD)  Vomiting of blood or coffee ground material  New chest pain or pain under the shoulder blades  Painful or persistently difficult swallowing  New shortness of breath  Fever of 100F or higher  Black, tarry-looking stools  For urgent or emergent issues, a gastroenterologist can be reached at any hour by calling [phone]. Do not use MyChart messaging for urgent concerns.    DIET:  We do recommend a small meal at first, but  then you may proceed to your regular diet.  Drink plenty of fluids but you should avoid alcoholic beverages for 24 hours.  ACTIVITY:  You should plan to take it easy for the rest of today and you should NOT DRIVE or use heavy machinery until tomorrow (because of the sedation medicines used during the test).    FOLLOW UP: Our staff will call the number listed on your records 48-72 hours following your procedure to check on you and address any questions or concerns that you may have regarding the information given to you following your procedure. If we do not reach you, we will leave a message.  We will attempt to reach you two times.  During this call, we will ask if you have developed any symptoms of COVID 19. If you develop any symptoms (ie: fever, flu-like symptoms, shortness of breath, cough etc.) before then, please call [phone].  If you test positive for Covid 19 in the 2 weeks post procedure, please call and report this information to us.    If any biopsies were taken you will be contacted by phone or by letter within the next 1-3 weeks.  Please call us at [phone] if you have not heard about the biopsies in 3 weeks.    SIGNATURES/CONFIDENTIALITY: You and/or your care partner have signed paperwork which will be entered into your electronic medical record.  These signatures attest to the fact that that the information above on   your After Visit Summary has been reviewed and is understood.  Full responsibility of the confidentiality of this discharge information lies with you and/or your care-partner. 

## 2021-03-04 ENCOUNTER — Telehealth: Payer: Self-pay

## 2021-03-04 ENCOUNTER — Other Ambulatory Visit: Payer: 59

## 2021-03-04 NOTE — Telephone Encounter (Signed)
  Follow up Call-  Call back number 03/02/2021  Post procedure Call Back phone  # [phone]  Permission to leave phone message Yes  Some recent data might be hidden     Patient questions:  Do you have a fever, pain , or abdominal swelling? No. Pain Score  0 *  Have you tolerated food without any problems? Yes.    Have you been able to return to your normal activities? Yes.    Do you have any questions about your discharge instructions: Diet   No. Medications  No. Follow up visit  No.  Do you have questions or concerns about your Care? No.  Actions: * If pain score is 4 or above: No action needed, pain <4.   Have you developed a fever since your procedure? No   2.   Have you had an respiratory symptoms (SOB or cough) since your procedure? No   3.   Have you tested positive for COVID 19 since your procedure no   4.   Have you had any family members/close contacts diagnosed with the COVID 19 since your procedure?  No    If yes to any of these questions please route to Joylene John, RN and Joella Prince, RN

## 2021-03-09 ENCOUNTER — Ambulatory Visit: Payer: 59

## 2021-03-09 ENCOUNTER — Other Ambulatory Visit: Payer: Self-pay

## 2021-03-09 ENCOUNTER — Ambulatory Visit
Admission: RE | Admit: 2021-03-09 | Discharge: 2021-03-09 | Disposition: A | Discharge status: Home / Self Care | Payer: 59 | Source: Ambulatory Visit | Attending: Family | Admitting: Family

## 2021-03-09 DIAGNOSIS — R928 Other abnormal and inconclusive findings on diagnostic imaging of breast: Secondary | ICD-10-CM

## 2021-03-09 NOTE — Progress Notes (Signed)
No evidence of malignancy. Repeat mammogram in 1 year.

## 2021-05-26 ENCOUNTER — Other Ambulatory Visit: Payer: Self-pay

## 2021-05-26 ENCOUNTER — Ambulatory Visit (INDEPENDENT_AMBULATORY_CARE_PROVIDER_SITE_OTHER): Payer: Self-pay | Admitting: Nurse Practitioner

## 2021-05-26 ENCOUNTER — Encounter: Payer: Self-pay | Admitting: Nurse Practitioner

## 2021-05-26 DIAGNOSIS — R197 Diarrhea, unspecified: Secondary | ICD-10-CM

## 2021-05-26 NOTE — Progress Notes (Signed)
Virtual Visit via Telephone Note  I connected with Pam Brown on 05/26/21 at  9:40 AM EST by telephone and verified that I am speaking with the correct person using two identifiers.  Location: Patient: home Provider: office   I discussed the limitations, risks, security and privacy concerns of performing an evaluation and management service by telephone and the availability of in person appointments. I also discussed with the patient that there may be a patient responsible charge related to this service. The patient expressed understanding and agreed to proceed.   History of Present Illness:  Patient presents today for sick visit through telephone visit.  Patient states that she was exposed to a stomach virus over the weekend.  Her son was vomiting and had diarrhea.  Patient states that Sunday she had a low-grade fever and yesterday she did have diarrhea.  She states that symptoms are improving today.  She has been trying to drink fluids.  She would like to return back to work.  We discussed that she may need 1 more day to recover so we will write a note for her to return to work on 05/28/2021 as long as symptoms have resolved. Denies f/c/s, n/v/d, hemoptysis, PND, chest pain or edema.     Observations/Objective:  Vitals with BMI 03/02/2021 03/02/2021 03/02/2021  Height - - -  Weight - - -  BMI - - -  Systolic [phone]  Diastolic 54 58 42  Pulse 52 70 60      Assessment and Plan:  Patient Instructions  Diarrhea:  Stay well hydrated  Stay active  Advance diet as tolerated - bland diet for now  Follow up:  Follow up if needed   Diarrhea, Adult Diarrhea is when you pass loose and watery poop (stool) often. Diarrhea can make you feel weak and cause you to lose water in your body (get dehydrated). Losing water in your body can cause you to: Feel tired and thirsty. Have a dry mouth. Go pee (urinate) less often. Diarrhea often lasts 2-3 days. However, it can last  longer if it is a sign of something more serious. It is important to treat your diarrhea as told by your doctor. Follow these instructions at home: Eating and drinking   Follow these instructions as told by your doctor: Take an ORS (oral rehydration solution). This is a drink that helps you replace fluids and minerals your body lost. It is sold at pharmacies and stores. Drink plenty of fluids, such as: Water. Ice chips. Diluted fruit juice. Low-calorie sports drinks. Milk, if you want. Avoid drinking fluids that have a lot of sugar or caffeine in them. Eat bland, easy-to-digest foods in small amounts as you are able. These foods include: Bananas. Applesauce. Rice. Low-fat (lean) meats. Toast. Crackers. Avoid alcohol. Avoid spicy or fatty foods.  Medicines Take over-the-counter and prescription medicines only as told by your doctor. If you were prescribed an antibiotic medicine, take it as told by your doctor. Do not stop using the antibiotic even if you start to feel better. General instructions  Wash your hands often using soap and water. If soap and water are not available, use a hand sanitizer. Others in your home should wash their hands as well. Hands should be washed: After using the toilet or changing a diaper. Before preparing, cooking, or serving food. While caring for a sick person. While visiting someone in a hospital. Drink enough fluid to keep your pee (urine) pale yellow. Rest at home  while you get better. Take a warm bath to help with any burning or pain from having diarrhea. Watch your condition for any changes. Keep all follow-up visits as told by your doctor. This is important. Contact a doctor if: You have a fever. Your diarrhea gets worse. You have new symptoms. You cannot keep fluids down. You feel light-headed or dizzy. You have a headache. You have muscle cramps. Get help right away if: You have chest pain. You feel very weak or you pass out  (faint). You have bloody or black poop or poop that looks like tar. You have very bad pain, cramping, or bloating in your belly (abdomen). You have trouble breathing or you are breathing very quickly. Your heart is beating very quickly. Your skin feels cold and clammy. You feel confused. You have signs of losing too much water in your body, such as: Dark pee, very little pee, or no pee. Cracked lips. Dry mouth. Sunken eyes. Sleepiness. Weakness. Summary Diarrhea is when you pass loose and watery poop (stool) often. Diarrhea can make you feel weak and cause you to lose water in your body (get dehydrated). Take an ORS (oral rehydration solution). This is a drink that is sold at pharmacies and stores. Eat bland, easy-to-digest foods in small amounts as you are able. Contact a doctor if your condition gets worse. Get help right away if you have signs that you have lost too much water in your body. This information is not intended to replace advice given to you by your health care provider. Make sure you discuss any questions you have with your health care provider. Document Revised: 10/22/2020 Document Reviewed: 10/22/2020 Elsevier Patient Education  2022 [employer].       I discussed the assessment and treatment plan with the patient. The patient was provided an opportunity to ask questions and all were answered. The patient agreed with the plan and demonstrated an understanding of the instructions.   The patient was advised to call back or seek an in-person evaluation if the symptoms worsen or if the condition fails to improve as anticipated.  I provided 23 minutes of non-face-to-face time during this encounter.   Fenton Foy, NP

## 2021-05-26 NOTE — Patient Instructions (Signed)
Diarrhea:  Stay well hydrated  Stay active  Advance diet as tolerated - bland diet for now  Follow up:  Follow up if needed   Diarrhea, Adult Diarrhea is when you pass loose and watery poop (stool) often. Diarrhea can make you feel weak and cause you to lose water in your body (get dehydrated). Losing water in your body can cause you to: Feel tired and thirsty. Have a dry mouth. Go pee (urinate) less often. Diarrhea often lasts 2-3 days. However, it can last longer if it is a sign of something more serious. It is important to treat your diarrhea as told by your doctor. Follow these instructions at home: Eating and drinking   Follow these instructions as told by your doctor: Take an ORS (oral rehydration solution). This is a drink that helps you replace fluids and minerals your body lost. It is sold at pharmacies and stores. Drink plenty of fluids, such as: Water. Ice chips. Diluted fruit juice. Low-calorie sports drinks. Milk, if you want. Avoid drinking fluids that have a lot of sugar or caffeine in them. Eat bland, easy-to-digest foods in small amounts as you are able. These foods include: Bananas. Applesauce. Rice. Low-fat (lean) meats. Toast. Crackers. Avoid alcohol. Avoid spicy or fatty foods.  Medicines Take over-the-counter and prescription medicines only as told by your doctor. If you were prescribed an antibiotic medicine, take it as told by your doctor. Do not stop using the antibiotic even if you start to feel better. General instructions  Wash your hands often using soap and water. If soap and water are not available, use a hand sanitizer. Others in your home should wash their hands as well. Hands should be washed: After using the toilet or changing a diaper. Before preparing, cooking, or serving food. While caring for a sick person. While visiting someone in a hospital. Drink enough fluid to keep your pee (urine) pale yellow. Rest at home while you get  better. Take a warm bath to help with any burning or pain from having diarrhea. Watch your condition for any changes. Keep all follow-up visits as told by your doctor. This is important. Contact a doctor if: You have a fever. Your diarrhea gets worse. You have new symptoms. You cannot keep fluids down. You feel light-headed or dizzy. You have a headache. You have muscle cramps. Get help right away if: You have chest pain. You feel very weak or you pass out (faint). You have bloody or black poop or poop that looks like tar. You have very bad pain, cramping, or bloating in your belly (abdomen). You have trouble breathing or you are breathing very quickly. Your heart is beating very quickly. Your skin feels cold and clammy. You feel confused. You have signs of losing too much water in your body, such as: Dark pee, very little pee, or no pee. Cracked lips. Dry mouth. Sunken eyes. Sleepiness. Weakness. Summary Diarrhea is when you pass loose and watery poop (stool) often. Diarrhea can make you feel weak and cause you to lose water in your body (get dehydrated). Take an ORS (oral rehydration solution). This is a drink that is sold at pharmacies and stores. Eat bland, easy-to-digest foods in small amounts as you are able. Contact a doctor if your condition gets worse. Get help right away if you have signs that you have lost too much water in your body. This information is not intended to replace advice given to you by your health care provider. Make sure  you discuss any questions you have with your health care provider. Document Revised: 10/22/2020 Document Reviewed: 10/22/2020 Elsevier Patient Education  [place].

## 2021-09-14 ENCOUNTER — Ambulatory Visit: Payer: 59

## 2021-09-22 NOTE — Progress Notes (Signed)
Patient ID: Pam Brown, female    DOB: [birth year]  MRN: [id number]  CC: Annual Physical Exam  Subjective: Pam Brown is a 46 y.o. female who presents for annual physical exam.   Her concerns today include:  - Request TB screening for employer [employer]. - Request Depo injection.  There are no problems to display for this patient.    Current Outpatient Medications on File Prior to Visit  Medication Sig Dispense Refill   diclofenac Sodium (VOLTAREN) 1 % GEL Apply 2 g topically 4 (four) times daily. 150 g 1   famotidine (PEPCID) 20 MG tablet Take 1 tablet (20 mg total) by mouth 2 (two) times daily. 30 tablet 0   No current facility-administered medications on file prior to visit.    No Known Allergies  Social History   Socioeconomic History   Marital status: Single    Spouse name: Not on file   Number of children: 4   Years of education: 17   Highest education level: Not on file  Occupational History   Occupation: Systems developer    Comment: [employer]  Tobacco Use   Smoking status: Never    Passive exposure: Never   Smokeless tobacco: Never  Vaping Use   Vaping Use: Never used  Substance and Sexual Activity   Alcohol use: No   Drug use: No   Sexual activity: Yes  Other Topics Concern   Not on file  Social History Narrative   Some sit ups sometimes while at work   Social Determinants of Radio broadcast assistant Strain: Not on file  Food Insecurity: Not on file  Transportation Needs: Not on file  Physical Activity: Not on file  Stress: Not on file  Social Connections: Not on file  Intimate Partner Violence: Not on file    Family History  Problem Relation Age of Onset   Cancer Mother    Diabetes Mother    Depression Father    Cancer Maternal Aunt    Aneurysm Maternal Grandmother    Colon cancer Neg Hx    Esophageal cancer Neg Hx    Stomach cancer Neg Hx    Rectal cancer Neg Hx     Past Surgical History:   Procedure Laterality Date   CESAREAN SECTION     4    ROS: Review of Systems Negative except as stated above  PHYSICAL EXAM: BP 135/90 (BP Location: Left Arm, Patient Position: Sitting, Cuff Size: Normal)   Pulse 75   Temp 98.3 F (36.8 C)   Resp 18   Ht 4' 9.01" (1.448 m)   Wt 120 lb (54.4 kg)   SpO2 98%   BMI 25.96 kg/m   Physical Exam HENT:     Head: Normocephalic and atraumatic.     Right Ear: Tympanic membrane, ear canal and external ear normal.     Left Ear: Tympanic membrane, ear canal and external ear normal.     Nose: Nose normal.     Mouth/Throat:     Mouth: Mucous membranes are moist.     Pharynx: Oropharynx is clear.  Eyes:     Extraocular Movements: Extraocular movements intact.     Conjunctiva/sclera: Conjunctivae normal.     Pupils: Pupils are equal, round, and reactive to light.  Cardiovascular:     Rate and Rhythm: Normal rate and regular rhythm.     Pulses: Normal pulses.     Heart sounds: Normal heart sounds.  Pulmonary:  Effort: Pulmonary effort is normal.     Breath sounds: Normal breath sounds.  Chest:     Comments: Patient declined.  Abdominal:     General: Bowel sounds are normal.     Palpations: Abdomen is soft.  Genitourinary:    Comments: Patient declined.  Musculoskeletal:        General: Normal range of motion.     Right shoulder: Normal.     Left shoulder: Normal.     Right upper arm: Normal.     Left upper arm: Normal.     Right elbow: Normal.     Left elbow: Normal.     Right forearm: Normal.     Left forearm: Normal.     Right wrist: Normal.     Left wrist: Normal.     Right hand: Normal.     Left hand: Normal.     Cervical back: Normal, normal range of motion and neck supple.     Thoracic back: Normal.     Lumbar back: Normal.     Right knee: Normal.     Left knee: Normal.     Right lower leg: Normal.     Left lower leg: Normal.     Right ankle: Normal.     Left ankle: Normal.     Right foot: Normal.      Left foot: Normal.  Skin:    General: Skin is warm and dry.     Capillary Refill: Capillary refill takes less than 2 seconds.  Neurological:     General: No focal deficit present.     Mental Status: She is alert and oriented to person, place, and time.  Psychiatric:        Mood and Affect: Mood normal.        Behavior: Behavior normal.    ASSESSMENT AND PLAN: 1. Annual physical exam: - Counseled on 150 minutes of exercise per week as tolerated, healthy eating (including decreased daily intake of saturated fats, cholesterol, added sugars, sodium), STI prevention, and routine healthcare maintenance.  2. Screening for metabolic disorder: - YBW38+LHTD to check kidney function, liver function, and electrolyte balance.  - CMP14+EGFR  3. Screening for deficiency anemia: - CBC to screen for anemia. - CBC  4. Diabetes mellitus screening: - Hemoglobin A1c to screen for pre-diabetes/diabetes. - Hemoglobin A1c  5. Screening cholesterol level: - Lipid panel to screen for high cholesterol.  - Lipid panel  6. Thyroid disorder screen: - TSH to check thyroid function.  - TSH  7. Encounter for PPD test: - TB skin test today in office. Return in 48 to 72 hours for CMA visit. - TB Skin Test  8. Encounter for Depo-Provera contraception: - Administered today in office.  - Return in 12 weeks for repeat injection. - medroxyPROGESTERone (DEPO-PROVERA) injection 150 mg   Patient was given the opportunity to ask questions.  Patient verbalized understanding of the plan and was able to repeat key elements of the plan. Patient was given clear instructions to go to Emergency Department or return to medical center if symptoms don't improve, worsen, or new problems develop.The patient verbalized understanding.   Orders Placed This Encounter  Procedures   CBC   Lipid panel   TSH   CMP14+EGFR   Hemoglobin A1c   TB Skin Test     Requested Prescriptions    No prescriptions requested or  ordered in this encounter    Return in about 1 year (around 09/29/2022) for Physical per patient  preference.  Pam Herter, NP

## 2021-09-28 ENCOUNTER — Ambulatory Visit (INDEPENDENT_AMBULATORY_CARE_PROVIDER_SITE_OTHER): Payer: 59 | Admitting: Family

## 2021-09-28 ENCOUNTER — Encounter: Payer: Self-pay | Admitting: Family

## 2021-09-28 VITALS — BP 135/90 | HR 75 | Temp 98.3°F | Resp 18 | Ht <= 58 in | Wt 120.0 lb

## 2021-09-28 DIAGNOSIS — Z0000 Encounter for general adult medical examination without abnormal findings: Secondary | ICD-10-CM

## 2021-09-28 DIAGNOSIS — Z111 Encounter for screening for respiratory tuberculosis: Secondary | ICD-10-CM | POA: Diagnosis not present

## 2021-09-28 DIAGNOSIS — Z3042 Encounter for surveillance of injectable contraceptive: Secondary | ICD-10-CM | POA: Diagnosis not present

## 2021-09-28 DIAGNOSIS — Z130 Encounter for screening for diseases of the blood and blood-forming organs and certain disorders involving the immune mechanism: Secondary | ICD-10-CM

## 2021-09-28 DIAGNOSIS — Z13220 Encounter for screening for lipoid disorders: Secondary | ICD-10-CM

## 2021-09-28 DIAGNOSIS — Z131 Encounter for screening for diabetes mellitus: Secondary | ICD-10-CM

## 2021-09-28 DIAGNOSIS — Z1329 Encounter for screening for other suspected endocrine disorder: Secondary | ICD-10-CM

## 2021-09-28 DIAGNOSIS — Z13228 Encounter for screening for other metabolic disorders: Secondary | ICD-10-CM

## 2021-09-28 MED ORDER — MEDROXYPROGESTERONE ACETATE 150 MG/ML IM SUSP
150.0000 mg | Freq: Once | INTRAMUSCULAR | Status: AC
  Administered 2021-09-28: 150 mg via INTRAMUSCULAR

## 2021-09-28 NOTE — Progress Notes (Signed)
.  Pt presents for annual physical exam  

## 2021-09-28 NOTE — Patient Instructions (Signed)
Preventive Care 46-46 Years Old, Female Preventive care refers to lifestyle choices and visits with your health care provider that can promote health and wellness. Preventive care visits are also called wellness exams. What can I expect for my preventive care visit? Counseling Your health care provider may ask you questions about your: Medical history, including: Past medical problems. Family medical history. Pregnancy history. Current health, including: Menstrual cycle. Method of birth control. Emotional well-being. Home life and relationship well-being. Sexual activity and sexual health. Lifestyle, including: Alcohol, nicotine or tobacco, and drug use. Access to firearms. Diet, exercise, and sleep habits. Work and work environment. Sunscreen use. Safety issues such as seatbelt and bike helmet use. Physical exam Your health care provider will check your: Height and weight. These may be used to calculate your BMI (body mass index). BMI is a measurement that tells if you are at a healthy weight. Waist circumference. This measures the distance around your waistline. This measurement also tells if you are at a healthy weight and may help predict your risk of certain diseases, such as type 2 diabetes and high blood pressure. Heart rate and blood pressure. Body temperature. Skin for abnormal spots. What immunizations do I need?  Vaccines are usually given at various ages, according to a schedule. Your health care provider will recommend vaccines for you based on your age, medical history, and lifestyle or other factors, such as travel or where you work. What tests do I need? Screening Your health care provider may recommend screening tests for certain conditions. This may include: Lipid and cholesterol levels. Diabetes screening. This is done by checking your blood sugar (glucose) after you have not eaten for a while (fasting). Pelvic exam and Pap test. Hepatitis B test. Hepatitis C  test. HIV (human immunodeficiency virus) test. STI (sexually transmitted infection) testing, if you are at risk. Lung cancer screening. Colorectal cancer screening. Mammogram. Talk with your health care provider about when you should start having regular mammograms. This may depend on whether you have a family history of breast cancer. BRCA-related cancer screening. This may be done if you have a family history of breast, ovarian, tubal, or peritoneal cancers. Bone density scan. This is done to screen for osteoporosis. Talk with your health care provider about your test results, treatment options, and if necessary, the need for more tests. Follow these instructions at home: Eating and drinking  Eat a diet that includes fresh fruits and vegetables, whole grains, lean protein, and low-fat dairy products. Take vitamin and mineral supplements as recommended by your health care provider. Do not drink alcohol if: Your health care provider tells you not to drink. You are pregnant, may be pregnant, or are planning to become pregnant. If you drink alcohol: Limit how much you have to 0-1 drink a day. Know how much alcohol is in your drink. In the [place] one drink equals one 12 oz bottle of beer (355 mL), one 5 oz glass of wine (148 mL), or one 1 oz glass of hard liquor (44 mL). Lifestyle Brush your teeth every morning and night with fluoride toothpaste. Floss one time each day. Exercise for at least 30 minutes 5 or more days each week. Do not use any products that contain nicotine or tobacco. These products include cigarettes, chewing tobacco, and vaping devices, such as e-cigarettes. If you need help quitting, ask your health care provider. Do not use drugs. If you are sexually active, practice safe sex. Use a condom or other form of protection to   prevent STIs. If you do not wish to become pregnant, use a form of birth control. If you plan to become pregnant, see your health care provider for a  prepregnancy visit. Take aspirin only as told by your health care provider. Make sure that you understand how much to take and what form to take. Work with your health care provider to find out whether it is safe and beneficial for you to take aspirin daily. Find healthy ways to manage stress, such as: Meditation, yoga, or listening to music. Journaling. Talking to a trusted person. Spending time with friends and family. Minimize exposure to UV radiation to reduce your risk of skin cancer. Safety Always wear your seat belt while driving or riding in a vehicle. Do not drive: If you have been drinking alcohol. Do not ride with someone who has been drinking. When you are tired or distracted. While texting. If you have been using any mind-altering substances or drugs. Wear a helmet and other protective equipment during sports activities. If you have firearms in your house, make sure you follow all gun safety procedures. Seek help if you have been physically or sexually abused. What's next? Visit your health care provider once a year for an annual wellness visit. Ask your health care provider how often you should have your eyes and teeth checked. Stay up to date on all vaccines. This information is not intended to replace advice given to you by your health care provider. Make sure you discuss any questions you have with your health care provider. Document Revised: 10/08/2020 Document Reviewed: 10/08/2020 Elsevier Patient Education  [place].

## 2021-09-29 LAB — TSH: TSH: 1.49 u[IU]/mL (ref 0.450–4.500)

## 2021-09-29 LAB — CBC
Hematocrit: 40.2 % (ref 34.0–46.6)
Hemoglobin: 13 g/dL (ref 11.1–15.9)
MCH: 27.8 pg (ref 26.6–33.0)
MCHC: 32.3 g/dL (ref 31.5–35.7)
MCV: 86 fL (ref 79–97)
Platelets: 402 10*3/uL (ref 150–450)
RBC: 4.68 x10E6/uL (ref 3.77–5.28)
RDW: 13.5 % (ref 11.7–15.4)
WBC: 8.3 10*3/uL (ref 3.4–10.8)

## 2021-09-29 LAB — HEMOGLOBIN A1C
Est. average glucose Bld gHb Est-mCnc: 105 mg/dL
Hgb A1c MFr Bld: 5.3 % (ref 4.8–5.6)

## 2021-09-29 LAB — CMP14+EGFR
ALT: 12 IU/L (ref 0–32)
AST: 13 IU/L (ref 0–40)
Albumin/Globulin Ratio: 2 (ref 1.2–2.2)
Albumin: 4.5 g/dL (ref 3.8–4.8)
Alkaline Phosphatase: 82 IU/L (ref 44–121)
BUN/Creatinine Ratio: 19 (ref 9–23)
BUN: 12 mg/dL (ref 6–24)
Bilirubin Total: <0.2 mg/dL (ref 0.0–1.2)
CO2: 20 mmol/L (ref 20–29)
Calcium: 9.4 mg/dL (ref 8.7–10.2)
Chloride: 105 mmol/L (ref 96–106)
Creatinine, Ser: 0.63 mg/dL (ref 0.57–1.00)
Globulin, Total: 2.2 g/dL (ref 1.5–4.5)
Glucose: 76 mg/dL (ref 70–99)
Potassium: 4 mmol/L (ref 3.5–5.2)
Sodium: 138 mmol/L (ref 134–144)
Total Protein: 6.7 g/dL (ref 6.0–8.5)
eGFR: 111 mL/min/{1.73_m2} (ref >59)

## 2021-09-29 LAB — LIPID PANEL
Chol/HDL Ratio: 3.9 ratio (ref 0.0–4.4)
Cholesterol, Total: 154 mg/dL (ref 100–199)
HDL: 39 mg/dL — ABNORMAL LOW (ref >39)
LDL Chol Calc (NIH): 103 mg/dL — ABNORMAL HIGH (ref 0–99)
Triglycerides: 59 mg/dL (ref 0–149)
VLDL Cholesterol Cal: 12 mg/dL (ref 5–40)

## 2021-09-30 ENCOUNTER — Ambulatory Visit: Payer: 59

## 2021-09-30 LAB — TB SKIN TEST
Induration: 0 mm
TB Skin Test: NEGATIVE

## 2021-10-26 ENCOUNTER — Encounter: Payer: Self-pay | Admitting: Family

## 2021-10-28 ENCOUNTER — Encounter: Payer: Self-pay | Admitting: Family

## 2022-05-03 ENCOUNTER — Other Ambulatory Visit: Payer: Self-pay

## 2022-05-03 ENCOUNTER — Encounter (HOSPITAL_COMMUNITY): Payer: Self-pay

## 2022-05-03 ENCOUNTER — Ambulatory Visit (HOSPITAL_COMMUNITY)
Admission: RE | Admit: 2022-05-03 | Discharge: 2022-05-03 | Disposition: A | Discharge status: Home / Self Care | Payer: Managed Care, Other (non HMO) | Source: Ambulatory Visit | Attending: Family | Admitting: Family

## 2022-05-03 ENCOUNTER — Ambulatory Visit: Payer: Self-pay | Admitting: Physician Assistant

## 2022-05-03 VITALS — BP 148/80 | HR 68 | Temp 98.4°F | Resp 18

## 2022-05-03 DIAGNOSIS — R197 Diarrhea, unspecified: Secondary | ICD-10-CM

## 2022-05-03 NOTE — Discharge Instructions (Addendum)
As discussed, increase fluids as much as tolerated over the next several days. This is the most important treatment.   Try foods to firm up the stool. It's ok if you don't have appetite, as long as you're hydrating.

## 2022-05-03 NOTE — ED Provider Notes (Signed)
[place]    CSN: [id number] Arrival date & time: 05/03/22  9166     History   Chief Complaint Chief Complaint  Patient presents with   Diarrhea    HPI Pam Brown is a 47 y.o. female.  Presents with abdominal cramping and a few episodes of diarrhea that began early this morning. Diarrhea is soft, not liquid. No blood. No fevers No vomiting Denies nausea. Able to eat soup. Has taken in fluids p.o  Tried pepto  No known sick contacts   Past Medical History:  Diagnosis Date   Allergy    Depression    after mother's death 14 years ago   GERD (gastroesophageal reflux disease)    Sickle cell trait ([place])     There are no problems to display for this patient.   Past Surgical History:  Procedure Laterality Date   CESAREAN SECTION     4    OB History   No obstetric history on file.      Home Medications    Prior to Admission medications   Medication Sig Start Date End Date Taking? Authorizing Provider  diclofenac Sodium (VOLTAREN) 1 % GEL Apply 2 g topically 4 (four) times daily. 08/20/20   Nicolette Bang, MD  famotidine (PEPCID) 20 MG tablet Take 1 tablet (20 mg total) by mouth 2 (two) times daily. 11/17/20   Raspet, Derry Skill, PA-C    Family History Family History  Problem Relation Age of Onset   Cancer Mother    Diabetes Mother    Depression Father    Cancer Maternal Aunt    Aneurysm Maternal Grandmother    Colon cancer Neg Hx    Esophageal cancer Neg Hx    Stomach cancer Neg Hx    Rectal cancer Neg Hx     Social History Social History   Tobacco Use   Smoking status: Never    Passive exposure: Never   Smokeless tobacco: Never  Vaping Use   Vaping Use: Never used  Substance Use Topics   Alcohol use: No   Drug use: No     Allergies   Patient has no known allergies.   Review of Systems Review of Systems  Gastrointestinal:  Positive for diarrhea.   Per HPI  Physical Exam Triage Vital Signs ED Triage  Vitals  Enc Vitals Group     BP 05/03/22 0944 (!) 148/80     Pulse Rate 05/03/22 0944 68     Resp 05/03/22 0944 18     Temp 05/03/22 0944 98.4 F (36.9 C)     Temp src --      SpO2 05/03/22 0944 98 %     Weight --      Height --      Head Circumference --      Peak Flow --      Pain Score 05/03/22 0942 0     Pain Loc --      Pain Edu? --      Excl. in [place]? --    No data found.  Updated Vital Signs BP (!) 148/80   Pulse 68   Temp 98.4 F (36.9 C)   Resp 18   SpO2 98%    Physical Exam Vitals and nursing note reviewed.  Constitutional:      General: She is not in acute distress.    Appearance: Normal appearance.  HENT:     Mouth/Throat:     Mouth: Mucous membranes are moist.  Pharynx: Oropharynx is clear. No posterior oropharyngeal erythema.  Eyes:     Conjunctiva/sclera: Conjunctivae normal.  Cardiovascular:     Rate and Rhythm: Normal rate and regular rhythm.     Pulses: Normal pulses.     Heart sounds: Normal heart sounds.  Pulmonary:     Effort: Pulmonary effort is normal.     Breath sounds: Normal breath sounds.  Abdominal:     General: Bowel sounds are normal. There is no distension.     Palpations: There is no mass.     Tenderness: There is no abdominal tenderness. There is no guarding.  Musculoskeletal:     Cervical back: Normal range of motion.  Lymphadenopathy:     Cervical: No cervical adenopathy.  Skin:    General: Skin is warm and dry.  Neurological:     Mental Status: She is alert and oriented to person, place, and time.     UC Treatments / Results  Labs (all labs ordered are listed, but only abnormal results are displayed) Labs Reviewed - No data to display  EKG   Radiology No results found.  Procedures Procedures (including critical care time)  Medications Ordered in UC Medications - No data to display  Initial Impression / Assessment and Plan / UC Course  I have reviewed the triage vital signs and the nursing  notes.  Pertinent labs & imaging results that were available during my care of the patient were reviewed by me and considered in my medical decision making (see chart for details).  Symptoms began 8 hours ago. Difficult to know etiology but consider viral gastroenteritis Discussed most important treatment will be increasing fluids. Bland diet as tolerated, provided list of foods to try to firm up stool. Discussed symptoms may last several days. ED precautions if unable to tolerate fluids by mouth. Work notes provided. Patient agrees to plan.  Final Clinical Impressions(s) / UC Diagnoses   Final diagnoses:  Diarrhea, unspecified type     Discharge Instructions      As discussed, increase fluids as much as tolerated over the next several days. This is the most important treatment.   Try foods to firm up the stool. It's ok if you don't have appetite, as long as you're hydrating.    ED Prescriptions   None    PDMP not reviewed this encounter.   Kin Galbraith, Wells Guiles, [place] 05/03/22 1028

## 2022-05-03 NOTE — ED Triage Notes (Signed)
Pt reports diarrhea that started last night.

## 2022-05-05 ENCOUNTER — Ambulatory Visit (HOSPITAL_COMMUNITY)
Admission: EM | Admit: 2022-05-05 | Discharge: 2022-05-05 | Disposition: A | Discharge status: Home / Self Care | Payer: Commercial Managed Care - HMO

## 2022-05-05 ENCOUNTER — Encounter (HOSPITAL_COMMUNITY): Payer: Self-pay | Admitting: Emergency Medicine

## 2022-05-05 DIAGNOSIS — A084 Viral intestinal infection, unspecified: Secondary | ICD-10-CM | POA: Diagnosis not present

## 2022-05-05 NOTE — Discharge Instructions (Signed)
Your symptoms are most likely caused by a virus, it will work its way out your system over the next few days  You can use over-the-counter ibuprofen or Tylenol, which ever you have at home, to help manage fevers  Continue to promote hydration throughout the day by using electrolyte replacement solution such as Gatorade, body armor, Pedialyte, which ever you have at home  Try eating bland foods such as bread, rice, toast, fruit which are easier on the stomach to digest, avoid foods that are overly spicy, overly seasoned or greasy

## 2022-05-05 NOTE — ED Provider Notes (Signed)
[place]    CSN: [id number] Arrival date & time: 05/05/22  1057      History   Chief Complaint Chief Complaint  Patient presents with   Follow-up   Letter for School/Work    HPI Pam Brown is a 47 y.o. female.   Patient presents for note to return to work.  Was evaluated in urgent care 3 days prior due to diarrhea that had been present for 8 hours.  Diagnosed with viral illness, has been using Imodium which has been effective, endorses diarrhea has resolved.  Still experiencing general malaise and fatigue as well as night sweats but believes she is getting better.  Has been able to tolerate food and liquids.   Past Medical History:  Diagnosis Date   Allergy    Depression    after mother's death 14 years ago   GERD (gastroesophageal reflux disease)    Sickle cell trait ([place])     There are no problems to display for this patient.   Past Surgical History:  Procedure Laterality Date   CESAREAN SECTION     4    OB History   No obstetric history on file.      Home Medications    Prior to Admission medications   Medication Sig Start Date End Date Taking? Authorizing Provider  diclofenac Sodium (VOLTAREN) 1 % GEL Apply 2 g topically 4 (four) times daily. 08/20/20   Nicolette Bang, MD  famotidine (PEPCID) 20 MG tablet Take 1 tablet (20 mg total) by mouth 2 (two) times daily. 11/17/20   Raspet, Derry Skill, PA-C    Family History Family History  Problem Relation Age of Onset   Cancer Mother    Diabetes Mother    Depression Father    Cancer Maternal Aunt    Aneurysm Maternal Grandmother    Colon cancer Neg Hx    Esophageal cancer Neg Hx    Stomach cancer Neg Hx    Rectal cancer Neg Hx     Social History Social History   Tobacco Use   Smoking status: Never    Passive exposure: Never   Smokeless tobacco: Never  Vaping Use   Vaping Use: Never used  Substance Use Topics   Alcohol use: No   Drug use: No     Allergies    Patient has no known allergies.   Review of Systems Review of Systems  Constitutional: Negative.   HENT: Negative.    Respiratory: Negative.    Cardiovascular: Negative.   Gastrointestinal:  Positive for diarrhea. Negative for abdominal distention, abdominal pain, anal bleeding, blood in stool, constipation, nausea, rectal pain and vomiting.  Skin: Negative.   Neurological: Negative.      Physical Exam Triage Vital Signs ED Triage Vitals  Enc Vitals Group     BP 05/05/22 1158 133/78     Pulse Rate 05/05/22 1158 73     Resp 05/05/22 1158 16     Temp 05/05/22 1158 98 F (36.7 C)     Temp src --      SpO2 05/05/22 1158 97 %     Weight --      Height --      Head Circumference --      Peak Flow --      Pain Score 05/05/22 1159 0     Pain Loc --      Pain Edu? --      Excl. in [place]? --    No  data found.  Updated Vital Signs BP 133/78 (BP Location: Right Arm)   Pulse 73   Temp 98 F (36.7 C)   Resp 16   SpO2 97%   Visual Acuity Right Eye Distance:   Left Eye Distance:   Bilateral Distance:    Right Eye Near:   Left Eye Near:    Bilateral Near:     Physical Exam Constitutional:      Appearance: Normal appearance.  Eyes:     Extraocular Movements: Extraocular movements intact.  Pulmonary:     Effort: Pulmonary effort is normal.  Neurological:     Mental Status: She is alert and oriented to person, place, and time. Mental status is at baseline.      UC Treatments / Results  Labs (all labs ordered are listed, but only abnormal results are displayed) Labs Reviewed - No data to display  EKG   Radiology No results found.  Procedures Procedures (including critical care time)  Medications Ordered in UC Medications - No data to display  Initial Impression / Assessment and Plan / UC Course  I have reviewed the triage vital signs and the nursing notes.  Pertinent labs & imaging results that were available during my care of the patient were reviewed  by me and considered in my medical decision making (see chart for details).  Viral gastroenteritis  Vital signs are stable and patient is in no signs of distress nor toxic appearing, endorsing that symptoms are improving and diarrhea has resolved and therefore she is stable to return to work, note given, may continue use of Imodium as needed, advised increase fluid intake until strength is back to normal, may follow-up with his urgent care as needed Final Clinical Impressions(s) / UC Diagnoses   Final diagnoses:  None   Discharge Instructions   None    ED Prescriptions   None    PDMP not reviewed this encounter.   Hans Eden, NP 05/05/22 [phone]

## 2022-05-05 NOTE — ED Triage Notes (Signed)
Pt reports that she was seen here on Monday for "stomach flu" due to having diarrhea, fatigue, decreased appetite, etc.  Reports didn't get a note bc wanted to go back to work on Tuesday, pt tried but unable to due to diarrhea. Reports out of work yesterday. States that diarrhea is better due to taking Imodium and bland diet. Reports still some fatigue and night sweats but otherwise feeling better.

## 2022-06-12 IMAGING — MG MM DIGITAL DIAGNOSTIC UNILAT*R* W/ TOMO W/ CAD
4 series · 4 of 12 positions shown · non-contrast
Comparison: Previous exam(s).

CLINICAL DATA: 45-year-old female recalled from baseline screening
mammogram dated 02/04/2021 for a possible right breast asymmetry.

EXAM:
DIGITAL DIAGNOSTIC UNILATERAL RIGHT MAMMOGRAM WITH TOMOSYNTHESIS AND
CAD
TECHNIQUE: Right digital diagnostic mammography and breast tomosynthesis was
performed. The images were evaluated with computer-aided detection.

[R ML synth-2D]
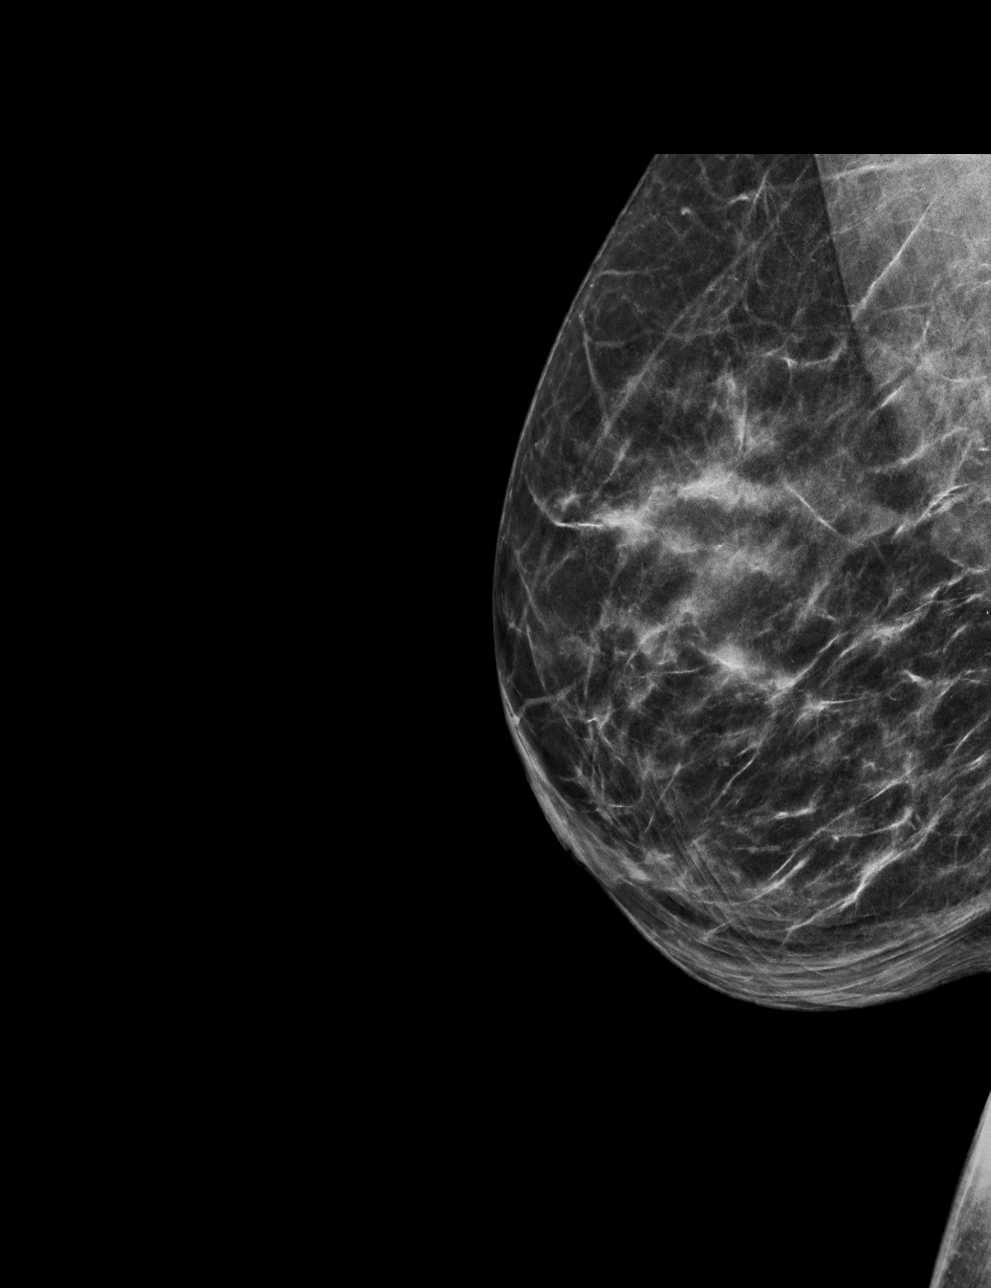

[R MLO synth-2D]
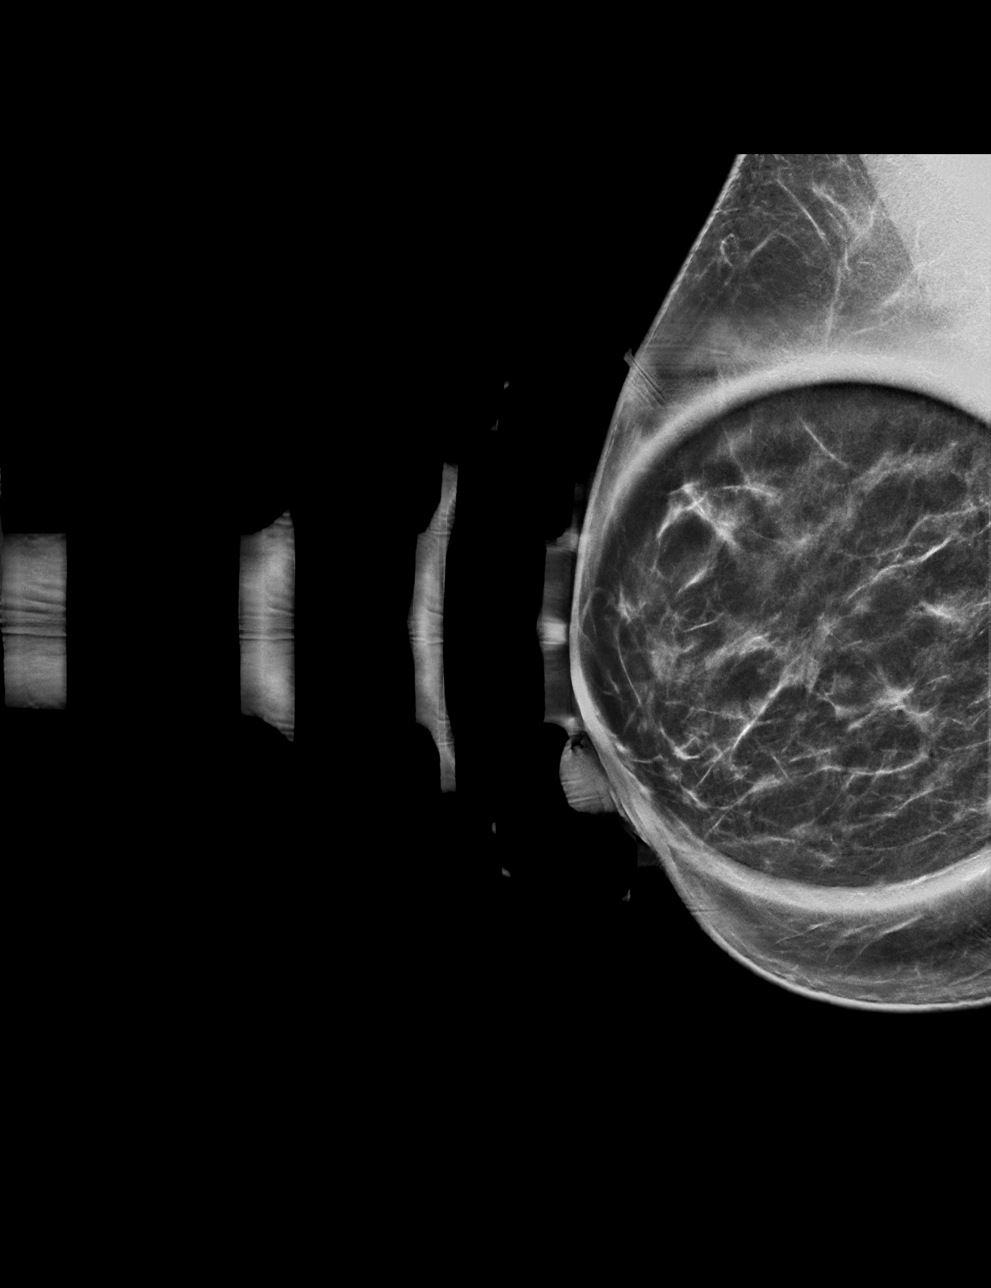

[R MLO tomo · tomo slice 29/58.0]
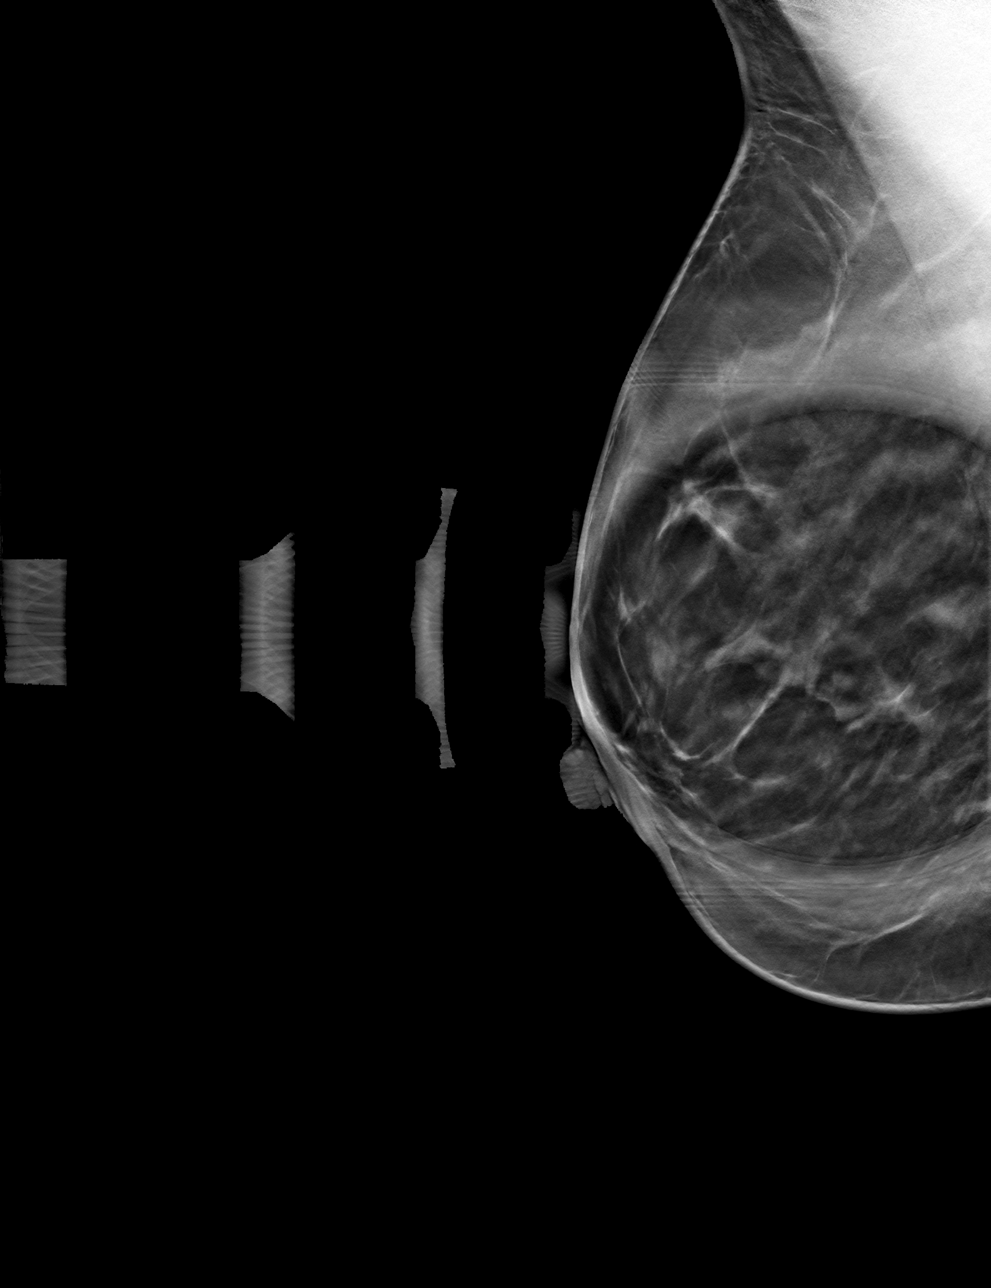

[R ML tomo · tomo slice 30/59.0]
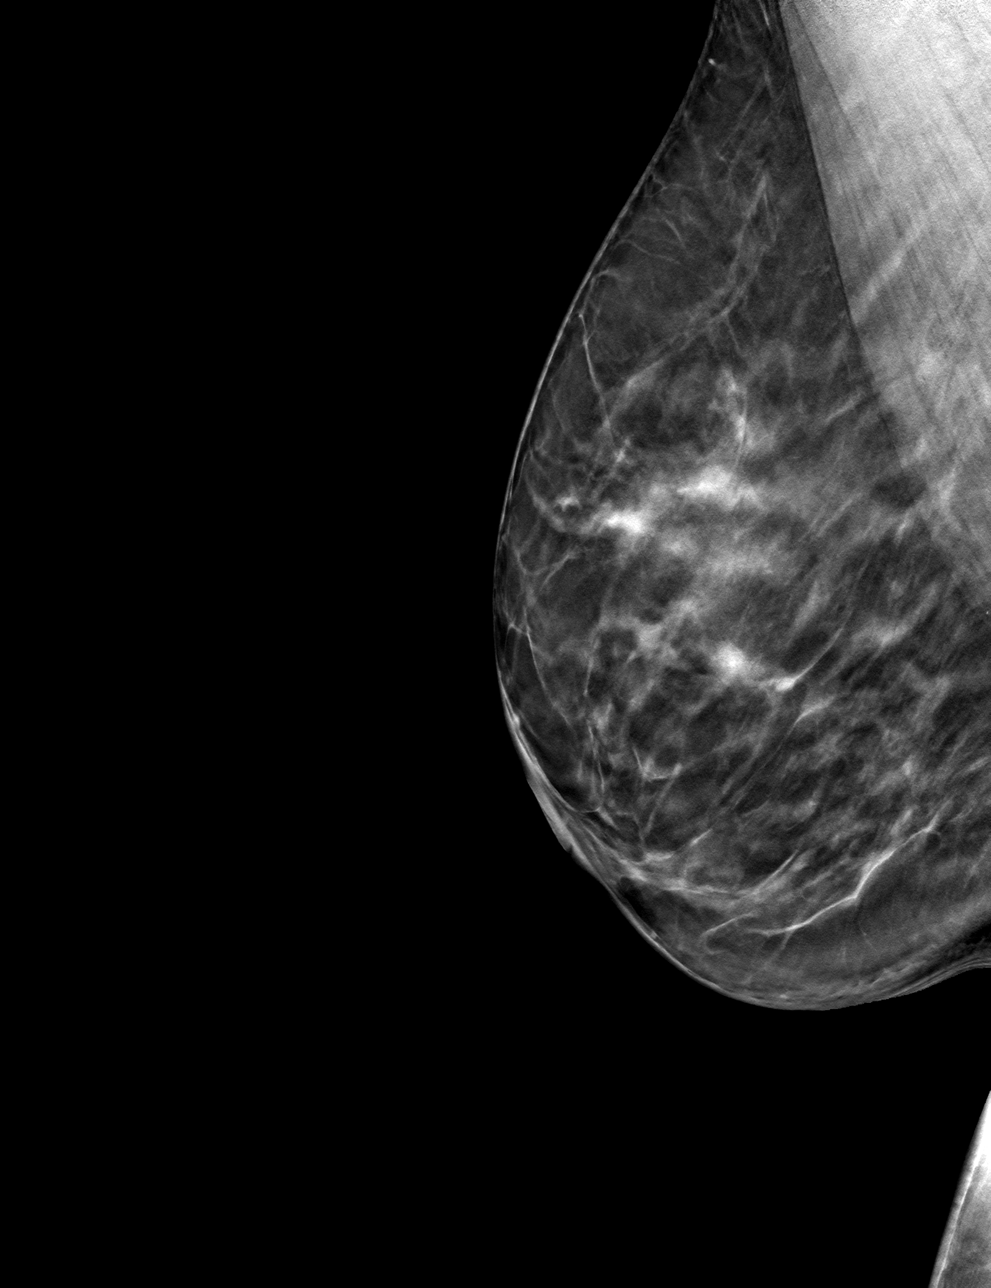

[4 of 12 positions shown; findings below may reference images not displayed]

ACR Breast Density Category c: The breast tissue is heterogeneously
dense, which may obscure small masses.
FINDINGS: Previously described asymmetry in the central right breast at mid
depth seen on the MLO projection resolves into well dispersed
fibroglandular tissue on today's additional views. No suspicious
findings identified.
IMPRESSION: No mammographic evidence of malignancy.

RECOMMENDATION:
Screening mammogram in one year.(Code:12-1-OIY)

I have discussed the findings and recommendations with the patient.
If applicable, a reminder letter will be sent to the patient
regarding the next appointment.

BI-RADS CATEGORY  1: Negative.

## 2023-05-24 ENCOUNTER — Encounter: Payer: Self-pay | Admitting: Family

## 2023-05-24 ENCOUNTER — Ambulatory Visit (INDEPENDENT_AMBULATORY_CARE_PROVIDER_SITE_OTHER): Payer: Commercial Managed Care - HMO | Admitting: Family

## 2023-05-24 ENCOUNTER — Other Ambulatory Visit: Payer: Self-pay | Admitting: Family

## 2023-05-24 VITALS — BP 124/76 | HR 72 | Temp 98.7°F | Ht <= 58 in | Wt 134.0 lb

## 2023-05-24 DIAGNOSIS — Z30011 Encounter for initial prescription of contraceptive pills: Secondary | ICD-10-CM

## 2023-05-24 DIAGNOSIS — N926 Irregular menstruation, unspecified: Secondary | ICD-10-CM | POA: Diagnosis not present

## 2023-05-24 LAB — POCT URINE PREGNANCY: Preg Test, Ur: NEGATIVE

## 2023-05-24 MED ORDER — NORGESTIMATE-ETH ESTRADIOL 0.25-35 MG-MCG PO TABS: 1.0000 | ORAL_TABLET | Freq: Every day | ORAL | 2 refills | Status: AC

## 2023-05-24 NOTE — Progress Notes (Signed)
Patient ID: Pam Brown, female    DOB: 12/15/1975  MRN: [id number]  CC: Birth Control  Subjective: Pam Brown is a 48 y.o. female who presents for birth control.   Her concerns today include:  - States she would like to begin birth control (prefers pill) due to having sexual intercourse with a new partner. History of irregular periods and established with Gynecology in the past.   There are no active problems to display for this patient.    Current Outpatient Medications on File Prior to Visit  Medication Sig Dispense Refill   diclofenac Sodium (VOLTAREN) 1 % GEL Apply 2 g topically 4 (four) times daily. 150 g 1   famotidine (PEPCID) 20 MG tablet Take 1 tablet (20 mg total) by mouth 2 (two) times daily. 30 tablet 0   No current facility-administered medications on file prior to visit.    No Known Allergies  Social History   Socioeconomic History   Marital status: Single    Spouse name: Not on file   Number of children: 4   Years of education: 17   Highest education level: Not on file  Occupational History   Occupation: Lexicographer    Comment: [employer]  Tobacco Use   Smoking status: Never    Passive exposure: Never   Smokeless tobacco: Never  Vaping Use   Vaping status: Never Used  Substance and Sexual Activity   Alcohol use: No   Drug use: No   Sexual activity: Yes  Other Topics Concern   Not on file  Social History Narrative   Some sit ups sometimes while at work   Social Drivers of Corporate investment banker Strain: Low Risk  (05/24/2023)   Overall Financial Resource Strain (CARDIA)    Difficulty of Paying Living Expenses: Not hard at all  Food Insecurity: No Food Insecurity (05/24/2023)   Hunger Vital Sign    Worried About Running Out of Food in the Last Year: Never true    Ran Out of Food in the Last Year: Never true  Transportation Needs: No Transportation Needs (05/24/2023)   PRAPARE - Scientist, research (physical sciences) (Medical): No    Lack of Transportation (Non-Medical): No  Physical Activity: Inactive (05/24/2023)   Exercise Vital Sign    Days of Exercise per Week: 0 days    Minutes of Exercise per Session: 0 min  Stress: No Stress Concern Present (05/24/2023)   [employer] of Occupational Health - Occupational Stress Questionnaire    Feeling of Stress : Not at all  Social Connections: Moderately Isolated (05/24/2023)   Social Connection and Isolation Panel [NHANES]    Frequency of Communication with Friends and Family: Three times a week    Frequency of Social Gatherings with Friends and Family: Three times a week    Attends Religious Services: More than 4 times per year    Active Member of Clubs or Organizations: No    Attends Banker Meetings: Never    Marital Status: Never married  Intimate Partner Violence: Not At Risk (05/24/2023)   Humiliation, Afraid, Rape, and Kick questionnaire    Fear of Current or Ex-Partner: No    Emotionally Abused: No    Physically Abused: No    Sexually Abused: No    Family History  Problem Relation Age of Onset   Cancer Mother    Diabetes Mother    Depression Father    Cancer Maternal Aunt  Aneurysm Maternal Grandmother    Colon cancer Neg Hx    Esophageal cancer Neg Hx    Stomach cancer Neg Hx    Rectal cancer Neg Hx     Past Surgical History:  Procedure Laterality Date   CESAREAN SECTION     4    ROS: Review of Systems Negative except as stated above  PHYSICAL EXAM: BP 124/76   Pulse 72   Temp 98.7 F (37.1 C) (Oral)   Ht 4\' 9"  (1.448 m)   Wt 134 lb (60.8 kg)   SpO2 98%   BMI 29.00 kg/m   Physical Exam HENT:     Head: Normocephalic and atraumatic.     Nose: Nose normal.     Mouth/Throat:     Mouth: Mucous membranes are moist.     Pharynx: Oropharynx is clear.  Eyes:     Extraocular Movements: Extraocular movements intact.     Conjunctiva/sclera: Conjunctivae normal.     Pupils: Pupils are  equal, round, and reactive to light.  Cardiovascular:     Rate and Rhythm: Normal rate and regular rhythm.     Pulses: Normal pulses.     Heart sounds: Normal heart sounds.  Pulmonary:     Effort: Pulmonary effort is normal.     Breath sounds: Normal breath sounds.  Musculoskeletal:        General: Normal range of motion.     Cervical back: Normal range of motion and neck supple.  Neurological:     General: No focal deficit present.     Mental Status: She is alert and oriented to person, place, and time.  Psychiatric:        Mood and Affect: Mood normal.        Behavior: Behavior normal.    ASSESSMENT AND PLAN: 1. Encounter for initial prescription of contraceptive pills (Primary) 2. Irregular menses - Routine screening. Will prescribe birth control pill once lab results negative. - Referral to Gynecology for evaluation/management.  - Follow-up with primary provider as scheduled.  - POCT urine pregnancy; Future - Ambulatory referral to Gynecology    Patient was given the opportunity to ask questions.  Patient verbalized understanding of the plan and was able to repeat key elements of the plan. Patient was given clear instructions to go to Emergency Department or return to medical center if symptoms don't improve, worsen, or new problems develop.The patient verbalized understanding.   Orders Placed This Encounter  Procedures   Ambulatory referral to Gynecology   POCT urine pregnancy   Follow-up with primary provider as scheduled.   Rema Fendt, NP

## 2023-05-24 NOTE — Progress Notes (Signed)
Patient wants to get on birth control. Because she states her cycle is about to start.She wants it to be done.    Wants Flu vaccine.

## 2023-06-01 ENCOUNTER — Telehealth: Payer: Self-pay | Admitting: Family

## 2023-06-01 ENCOUNTER — Encounter: Payer: Self-pay | Admitting: Family

## 2023-06-01 NOTE — Patient Instructions (Signed)
 Please call [facility] Address: [address]  [address]  [place], [place] [address]  Phone # [phone]

## 2023-06-01 NOTE — Progress Notes (Signed)
 Patient was upset that she is not able to get her depo shot today. Patient said that provider was aware that norgestimate -ethinyl estradiol  (ORTHO-CYCLEN) 0.25-35 MG-MCG tablet was less effective than medroxyPROGESTERone  (DEPO-PROVERA ) injection 150 mg. Patient said that she is still having an irregular period and she is wanting to get the depo shot so she cannot have a cycle at all. Patient also said that she last took the pill 2 days prior. I explain that giving her the shot may not be safe, but I will talk with provider.  I spoke with provider Greig Drones, PA and she agreed that depo would not be given today. Amy, PA mentioned that at last office visit with patient she was given a referred to Gynecology for further evaluation. I spoke back with patient about her referral and mention that GYN office has tried to call her three times without success. I gave patient gyn phone number to call them back at her leisure. Patient still insist on seeing provider today While getting her roomed I explain once again that provider would come and talk with you, however you cannot get the depo today. Patient was terribly upset and wanted office manager number.

## 2023-06-01 NOTE — Progress Notes (Signed)
 Erroneous encounter-disregard

## 2023-06-02 ENCOUNTER — Other Ambulatory Visit: Payer: Self-pay | Admitting: Family

## 2023-06-02 ENCOUNTER — Telehealth: Payer: Self-pay | Admitting: Physician Assistant

## 2023-06-02 DIAGNOSIS — Z30011 Encounter for initial prescription of contraceptive pills: Secondary | ICD-10-CM

## 2023-06-02 DIAGNOSIS — N926 Irregular menstruation, unspecified: Secondary | ICD-10-CM

## 2023-06-02 NOTE — Telephone Encounter (Signed)
 Complete

## 2023-06-02 NOTE — Telephone Encounter (Signed)
 Copied from CRM [id number]. Topic: Referral - Request for Referral >> Jun 01, 2023 11:52 AM Wyona SQUIBB wrote: Did the patient discuss referral with their provider in the last year? Yes (If No - schedule appointment) (If Yes - send message)  Appointment offered? Yes, office where pt was referred to would not schedule until April. Pt would like to be scheduled to Gyno below to get sooner appt.   Type of order/referral and detailed reason for visit: Irregular menses  Preference of office, provider, location:  [facility]  [address] [place], [place] [address]  If referral order, have you been seen by this specialty before? No (If Yes, this issue or another issue? When? Where?  Can we respond through MyChart? No, pt would prefer Phone call [phone]

## 2023-06-06 NOTE — Telephone Encounter (Signed)
 Called patient , she stated that she was not aware that the provider wanted her to see a specialist and the appointment was geared towards explaining that, she is not upset and will keep Amy as a PCP. Instead of waiting until  April to see the specialist we referred her to , she found another provider that will see her this Thursday at [facility]. Patient will stop by today to complete ROI to send records to their GYN office.

## 2023-08-02 ENCOUNTER — Ambulatory Visit: Payer: Commercial Managed Care - HMO | Admitting: Radiology

## 2023-11-15 ENCOUNTER — Ambulatory Visit (INDEPENDENT_AMBULATORY_CARE_PROVIDER_SITE_OTHER): Payer: Self-pay | Admitting: Family

## 2023-11-15 ENCOUNTER — Ambulatory Visit: Payer: Self-pay

## 2023-11-15 ENCOUNTER — Encounter: Payer: Self-pay | Admitting: Family

## 2023-11-15 VITALS — BP 138/81 | HR 58 | Temp 99.2°F | Resp 16 | Ht <= 58 in | Wt 117.2 lb

## 2023-11-15 DIAGNOSIS — Z23 Encounter for immunization: Secondary | ICD-10-CM | POA: Diagnosis not present

## 2023-11-15 DIAGNOSIS — R197 Diarrhea, unspecified: Secondary | ICD-10-CM

## 2023-11-15 NOTE — Telephone Encounter (Signed)
 FYI Only or Action Required?: FYI only for provider.  Patient was last seen in primary care on 05/24/2023 by Lorren Greig PARAS, NP.  Called Nurse Triage reporting Diarrhea.  Symptoms began a week ago.  Interventions attempted: Nothing.  Symptoms are: gradually improving.  Triage Disposition: No disposition on file.  Patient/caregiver understands and will follow disposition?:               Copied from CRM [id number]. Topic: Clinical - Red Word Triage >> Nov 15, 2023  8:05 AM Willma SAUNDERS wrote: Red Word that prompted transfer to Nurse Triage: Patient has had an upset stomach and diarrhea since yesterday morning. Reason for Disposition  [1] MODERATE diarrhea (e.g., 4-6 times / day more than normal) AND [2] present > 48 hours (2 days)  Answer Assessment - Initial Assessment Questions 1. DIARRHEA SEVERITY: How bad is the diarrhea? How many more stools have you had in the past 24 hours than normal?      10 times 2. ONSET: When did the diarrhea begin?      Monday am 3. STOOL DESCRIPTION:  How loose or watery is the diarrhea? What is the stool color? Is there any blood or mucous in the stool?     water 4. VOMITING: Are you also vomiting? If Yes, ask: How many times in the past 24 hours?      no 5. ABDOMEN PAIN: Are you having any abdomen pain? If Yes, ask: What does it feel like? (e.g., crampy, dull, intermittent, constant)      Cramps last week 6. ABDOMEN PAIN SEVERITY: If present, ask: How bad is the pain?  (e.g., Scale 1-10; mild, moderate, or severe)     denies 7. ORAL INTAKE: If vomiting, Have you been able to drink liquids? How much liquids have you had in the past 24 hours?     Na no vomiting 8. HYDRATION: Any signs of dehydration? (e.g., dry mouth [not just dry lips], too weak to stand, dizziness, new weight loss) When did you last urinate?     denies 9. EXPOSURE: Have you traveled to a foreign country recently? Have you been exposed to  anyone with diarrhea? Could you have eaten any food that was spoiled?     no 10. ANTIBIOTIC USE: Are you taking antibiotics now or have you taken antibiotics in the past 2 months?       no 11. OTHER SYMPTOMS: Do you have any other symptoms? (e.g., fever, blood in stool)       no 12. PREGNANCY: Is there any chance you are pregnant? When was your last menstrual period?       na  Protocols used: [facility]

## 2023-11-15 NOTE — Progress Notes (Signed)
 Patient client was sick and he had diarrhea that smelled weird  so Sunday night when she left work she was feeling weird then around 3am she started having diarrhea.  Patient had a little this morning

## 2023-11-15 NOTE — Progress Notes (Signed)
 Patient ID: Pam Brown, female    DOB: 01/28/1976  MRN: [id number]  CC: Diarrhea  Subjective: Pam Brown is a 48 y.o. female who presents for diarrhea.   Her concerns today include:  Diarrhea for several days and has improved since began. States she is still having some diarrhea but overall improved. States one bowel movement had a bad odor otherwise denies red flag symptoms.States she thinks she got diarrhea from her clients who also had diarrhea. States also may have been something she ate. States she has been eating light which helps. She would like letter to return to work on tomorrow.   There are no active problems to display for this patient.    Current Outpatient Medications on File Prior to Visit  Medication Sig Dispense Refill   diclofenac  Sodium (VOLTAREN ) 1 % GEL Apply 2 g topically 4 (four) times daily. 150 g 1   etonogestrel (NEXPLANON) 68 MG IMPL implant PROVIDED BY [facility].     famotidine  (PEPCID ) 20 MG tablet Take 1 tablet (20 mg total) by mouth 2 (two) times daily. 30 tablet 0   norgestimate -ethinyl estradiol  (ORTHO-CYCLEN) 0.25-35 MG-MCG tablet Take 1 tablet by mouth daily. (Patient not taking: Reported on 11/15/2023) 28 tablet 2   No current facility-administered medications on file prior to visit.    No Known Allergies  Social History   Socioeconomic History   Marital status: Single    Spouse name: Not on file   Number of children: 4   Years of education: 17   Highest education level: Not on file  Occupational History   Occupation: Lexicographer    Comment: [employer]  Tobacco Use   Smoking status: Never    Passive exposure: Never   Smokeless tobacco: Never  Vaping Use   Vaping status: Never Used  Substance and Sexual Activity   Alcohol use: No   Drug use: No   Sexual activity: Yes  Other Topics Concern   Not on file  Social History Narrative   Some sit ups sometimes while at work   Social Drivers of Manufacturing engineer Strain: Low Risk  (05/24/2023)   Overall Financial Resource Strain (CARDIA)    Difficulty of Paying Living Expenses: Not hard at all  Food Insecurity: No Food Insecurity (05/24/2023)   Hunger Vital Sign    Worried About Running Out of Food in the Last Year: Never true    Ran Out of Food in the Last Year: Never true  Transportation Needs: No Transportation Needs (05/24/2023)   PRAPARE - Administrator, Civil Service (Medical): No    Lack of Transportation (Non-Medical): No  Physical Activity: Inactive (05/24/2023)   Exercise Vital Sign    Days of Exercise per Week: 0 days    Minutes of Exercise per Session: 0 min  Stress: No Stress Concern Present (05/24/2023)   [employer] of Occupational Health - Occupational Stress Questionnaire    Feeling of Stress : Not at all  Social Connections: Moderately Isolated (05/24/2023)   Social Connection and Isolation Panel    Frequency of Communication with Friends and Family: Three times a week    Frequency of Social Gatherings with Friends and Family: Three times a week    Attends Religious Services: More than 4 times per year    Active Member of Clubs or Organizations: No    Attends Banker Meetings: Never    Marital Status: Never married  Intimate  Partner Violence: Not At Risk (05/24/2023)   Humiliation, Afraid, Rape, and Kick questionnaire    Fear of Current or Ex-Partner: No    Emotionally Abused: No    Physically Abused: No    Sexually Abused: No    Family History  Problem Relation Age of Onset   Cancer Mother    Diabetes Mother    Depression Father    Cancer Maternal Aunt    Aneurysm Maternal Grandmother    Colon cancer Neg Hx    Esophageal cancer Neg Hx    Stomach cancer Neg Hx    Rectal cancer Neg Hx     Past Surgical History:  Procedure Laterality Date   CESAREAN SECTION     4    ROS: Review of Systems Negative except as stated above  PHYSICAL EXAM: BP 138/81   Pulse  (!) 58   Temp 99.2 F (37.3 C) (Oral)   Resp 16   Ht 4' 9 (1.448 m)   Wt 117 lb 3.2 oz (53.2 kg)   LMP 11/13/2023 (Exact Date)   SpO2 97%   BMI 25.36 kg/m   Physical Exam HENT:     Head: Normocephalic and atraumatic.     Nose: Nose normal.     Mouth/Throat:     Mouth: Mucous membranes are moist.     Pharynx: Oropharynx is clear.  Eyes:     Extraocular Movements: Extraocular movements intact.     Conjunctiva/sclera: Conjunctivae normal.     Pupils: Pupils are equal, round, and reactive to light.  Cardiovascular:     Rate and Rhythm: Bradycardia present.     Pulses: Normal pulses.     Heart sounds: Normal heart sounds.  Pulmonary:     Effort: Pulmonary effort is normal.     Breath sounds: Normal breath sounds.  Abdominal:     General: Bowel sounds are normal.     Palpations: Abdomen is soft.  Musculoskeletal:        General: Normal range of motion.     Cervical back: Normal range of motion and neck supple.  Neurological:     General: No focal deficit present.     Mental Status: She is alert and oriented to person, place, and time.  Psychiatric:        Mood and Affect: Mood normal.        Behavior: Behavior normal.     ASSESSMENT AND PLAN: 1. Diarrhea, unspecified type (Primary) - Overall improved since began. - I discussed with patient in detail conservative management. Patient verbalized agreement/understanding.  - Patient declined referral to specialist.  - Patient provided with return to work letter dated 11/16/2023. - Follow-up with primary provider as scheduled.  2. Immunization due - Administered. - Heplisav-B  (HepB-CPG) Vaccine   Patient was given the opportunity to ask questions.  Patient verbalized understanding of the plan and was able to repeat key elements of the plan. Patient was given clear instructions to go to Emergency Department or return to medical center if symptoms don't improve, worsen, or new problems develop.The patient verbalized  understanding.   Orders Placed This Encounter  Procedures   Heplisav-B  (HepB-CPG) Vaccine   Follow-up with primary provider as scheduled.   Greig JINNY Drones, NP
